# Patient Record
Sex: Male | Born: 1945 | Race: Black or African American | Hispanic: No | Smoking: Never smoker
Health system: Southern US, Community
[De-identification: ages and names within clinical notes are randomized; demographics above are authoritative.]

## PROBLEM LIST (undated history)

## (undated) DIAGNOSIS — E785 Hyperlipidemia, unspecified: Secondary | ICD-10-CM

## (undated) DIAGNOSIS — Z8619 Personal history of other infectious and parasitic diseases: Secondary | ICD-10-CM

## (undated) DIAGNOSIS — M1990 Unspecified osteoarthritis, unspecified site: Secondary | ICD-10-CM

## (undated) DIAGNOSIS — M179 Osteoarthritis of knee, unspecified: Secondary | ICD-10-CM

## (undated) DIAGNOSIS — N400 Enlarged prostate without lower urinary tract symptoms: Secondary | ICD-10-CM

## (undated) DIAGNOSIS — D709 Neutropenia, unspecified: Secondary | ICD-10-CM

## (undated) DIAGNOSIS — D126 Benign neoplasm of colon, unspecified: Secondary | ICD-10-CM

## (undated) DIAGNOSIS — I10 Essential (primary) hypertension: Secondary | ICD-10-CM

## (undated) DIAGNOSIS — H409 Unspecified glaucoma: Secondary | ICD-10-CM

## (undated) HISTORY — PX: CATARACT EXTRACTION, BILATERAL: SHX1313

## (undated) HISTORY — PX: CHOLECYSTECTOMY: SHX55

## (undated) HISTORY — PX: JOINT REPLACEMENT: SHX530

## (undated) HISTORY — PX: KNEE SURGERY: SHX244

## (undated) HISTORY — PX: EYE SURGERY: SHX253

---

## 2012-05-27 DIAGNOSIS — H40219 Acute angle-closure glaucoma, unspecified eye: Secondary | ICD-10-CM | POA: Insufficient documentation

## 2012-07-31 DIAGNOSIS — Z961 Presence of intraocular lens: Secondary | ICD-10-CM | POA: Insufficient documentation

## 2014-06-10 DIAGNOSIS — D709 Neutropenia, unspecified: Secondary | ICD-10-CM | POA: Insufficient documentation

## 2014-06-10 DIAGNOSIS — N400 Enlarged prostate without lower urinary tract symptoms: Secondary | ICD-10-CM | POA: Insufficient documentation

## 2014-06-10 DIAGNOSIS — I10 Essential (primary) hypertension: Secondary | ICD-10-CM | POA: Insufficient documentation

## 2014-06-10 DIAGNOSIS — E785 Hyperlipidemia, unspecified: Secondary | ICD-10-CM | POA: Insufficient documentation

## 2015-02-05 DIAGNOSIS — N4231 Prostatic intraepithelial neoplasia: Secondary | ICD-10-CM | POA: Insufficient documentation

## 2015-02-05 DIAGNOSIS — N529 Male erectile dysfunction, unspecified: Secondary | ICD-10-CM | POA: Insufficient documentation

## 2015-09-09 ENCOUNTER — Encounter: Payer: Self-pay | Admitting: *Deleted

## 2015-09-10 ENCOUNTER — Ambulatory Visit: Payer: Commercial Managed Care - HMO | Admitting: *Deleted

## 2015-09-10 ENCOUNTER — Encounter: Payer: Self-pay | Admitting: *Deleted

## 2015-09-10 ENCOUNTER — Encounter
Admission: RE | Disposition: A | Discharge status: Home / Self Care | Payer: Self-pay | Source: Ambulatory Visit | Attending: Gastroenterology

## 2015-09-10 ENCOUNTER — Ambulatory Visit
Admission: RE | Admit: 2015-09-10 | Discharge: 2015-09-10 | Disposition: A | Discharge status: Home / Self Care | Payer: Commercial Managed Care - HMO | Source: Ambulatory Visit | Attending: Gastroenterology | Admitting: Gastroenterology

## 2015-09-10 DIAGNOSIS — H409 Unspecified glaucoma: Secondary | ICD-10-CM | POA: Insufficient documentation

## 2015-09-10 DIAGNOSIS — Z9849 Cataract extraction status, unspecified eye: Secondary | ICD-10-CM | POA: Diagnosis not present

## 2015-09-10 DIAGNOSIS — Z1211 Encounter for screening for malignant neoplasm of colon: Secondary | ICD-10-CM | POA: Insufficient documentation

## 2015-09-10 DIAGNOSIS — N400 Enlarged prostate without lower urinary tract symptoms: Secondary | ICD-10-CM | POA: Diagnosis not present

## 2015-09-10 DIAGNOSIS — D709 Neutropenia, unspecified: Secondary | ICD-10-CM | POA: Insufficient documentation

## 2015-09-10 DIAGNOSIS — Z79899 Other long term (current) drug therapy: Secondary | ICD-10-CM | POA: Insufficient documentation

## 2015-09-10 DIAGNOSIS — E785 Hyperlipidemia, unspecified: Secondary | ICD-10-CM | POA: Insufficient documentation

## 2015-09-10 DIAGNOSIS — D123 Benign neoplasm of transverse colon: Secondary | ICD-10-CM | POA: Insufficient documentation

## 2015-09-10 DIAGNOSIS — D122 Benign neoplasm of ascending colon: Secondary | ICD-10-CM | POA: Diagnosis not present

## 2015-09-10 DIAGNOSIS — I10 Essential (primary) hypertension: Secondary | ICD-10-CM | POA: Insufficient documentation

## 2015-09-10 DIAGNOSIS — D124 Benign neoplasm of descending colon: Secondary | ICD-10-CM | POA: Insufficient documentation

## 2015-09-10 HISTORY — DX: Unspecified glaucoma: H40.9

## 2015-09-10 HISTORY — DX: Hyperlipidemia, unspecified: E78.5

## 2015-09-10 HISTORY — DX: Neutropenia, unspecified: D70.9

## 2015-09-10 HISTORY — DX: Benign prostatic hyperplasia without lower urinary tract symptoms: N40.0

## 2015-09-10 HISTORY — DX: Essential (primary) hypertension: I10

## 2015-09-10 HISTORY — PX: COLONOSCOPY WITH PROPOFOL: SHX5780

## 2015-09-10 SURGERY — COLONOSCOPY WITH PROPOFOL
Anesthesia: General

## 2015-09-10 MED ORDER — LIDOCAINE HCL (CARDIAC) 20 MG/ML IV SOLN
INTRAVENOUS | Status: DC | PRN
  Administered 2015-09-10: 60 mg via INTRAVENOUS

## 2015-09-10 MED ORDER — SODIUM CHLORIDE 0.9 % IV SOLN
INTRAVENOUS | Status: DC
  Administered 2015-09-10: 1000 mL via INTRAVENOUS

## 2015-09-10 MED ORDER — PROPOFOL 500 MG/50ML IV EMUL
INTRAVENOUS | Status: DC | PRN
  Administered 2015-09-10: 140 ug/kg/min via INTRAVENOUS

## 2015-09-10 MED ORDER — GLYCOPYRROLATE 0.2 MG/ML IJ SOLN
INTRAMUSCULAR | Status: DC | PRN
  Administered 2015-09-10: 0.2 mg via INTRAVENOUS
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
## 2015-09-10 NOTE — Anesthesia Preprocedure Evaluation (Signed)
Anesthesia Evaluation  Patient identified by MRN, date of birth, ID band Patient awake    Reviewed: Allergy & Precautions, NPO status , Patient's Chart, lab work & pertinent test results  Airway Mallampati: II  TM Distance: >3 FB Neck ROM: Limited    Dental  (+) Missing, Poor Dentition   Pulmonary    Pulmonary exam normal        Cardiovascular Exercise Tolerance: Good hypertension, Pt. on medications Normal cardiovascular exam     Neuro/Psych    GI/Hepatic negative GI ROS, Neg liver ROS,   Endo/Other  negative endocrine ROS  Renal/GU negative Renal ROS     Musculoskeletal   Abdominal (+)  Abdomen: soft.    Peds  Hematology   Anesthesia Other Findings   Reproductive/Obstetrics                             Anesthesia Physical Anesthesia Plan  ASA: II  Anesthesia Plan: General   Post-op Pain Management:    Induction: Intravenous  Airway Management Planned: Nasal Cannula  Additional Equipment:   Intra-op Plan:   Post-operative Plan:   Informed Consent: I have reviewed the patients History and Physical, chart, labs and discussed the procedure including the risks, benefits and alternatives for the proposed anesthesia with the patient or authorized representative who has indicated his/her understanding and acceptance.     Plan Discussed with: CRNA  Anesthesia Plan Comments:         Anesthesia Quick Evaluation

## 2015-09-10 NOTE — Op Note (Signed)
[facility] Gastroenterology Patient Name: Nathan Merritt Procedure Date: 09/10/2015 10:27 AM MRN: [id number] Account #: [id number] Date of Birth: 1946/04/10 Admit Type: Outpatient Age: 70 Room: [facility] ENDO ROOM 2 Gender: Male Note Status: Finalized Procedure:         Colonoscopy Indications:       Screening for colorectal malignant neoplasm, This is the                     patient's first colonoscopy Patient Profile:   This is a 70 year old male. Providers:         Gerrit Heck. Rayann Heman, MD Referring MD:      Hewitt Blade. Sarina Ser, MD (Referring MD) Medicines:         Propofol per Anesthesia Complications:     No immediate complications. Procedure:         Pre-Anesthesia Assessment:                    - Prior to the procedure, a History and Physical was                     performed, and patient medications, allergies and                     sensitivities were reviewed. The patient's tolerance of                     previous anesthesia was reviewed.                    - Prior to the procedure, a History and Physical was                     performed, and patient medications, allergies and                     sensitivities were reviewed. The patient's tolerance of                     previous anesthesia was reviewed.                    After obtaining informed consent, the colonoscope was                     passed under direct vision. Throughout the procedure, the                     patient's blood pressure, pulse, and oxygen saturations                     were monitored continuously. The Colonoscope was                     introduced through the anus and advanced to the the cecum,                     identified by appendiceal orifice and ileocecal valve. The                     colonoscopy was performed without difficulty. The patient                     tolerated the procedure well. The quality of the bowel  preparation was excellent. Findings:  The perianal and digital rectal examinations were normal.      A 9 mm polyp was found in the mid ascending colon. The polyp was       sessile. The polyp was removed with a hot snare. Resection and retrieval       were complete.      Five sessile polyps were found at the hepatic flexure. The polyps were 3       to 10 mm in size. These polyps were removed with a hot snare and cold       snare as appropriate. Resection and retrieval were complete.      Three sessile polyps were found in the proximal transverse colon. The       polyps were 3 to 6 mm in size. These polyps were removed with a cold       snare. Resection and retrieval were complete.      Four sessile polyps were found in the descending colon. The polyps were       4 to 10 mm in size. These polyps were removed with a hot snare.       Resection and retrieval were complete.      A 15 mm polyp was found in the distal descending colon. The polyp was       sessile. The polyp was removed with a piecemeal technique using a hot       snare. Resection and retrieval were complete. Area was tattooed with an       injection of 3 mL of [place] ink.      The exam was otherwise without abnormality on direct and retroflexion       views. Impression:        - One 7 mm polyp in the mid ascending colon. Resected and                     retrieved.                    - Five 3 to 8 mm polyps at the hepatic flexure. Resected                     and retrieved.                    - Three 3 to 5 mm polyps in the proximal transverse colon.                     Resected and retrieved.                    - Four 4 to 10 mm polyps in the descending colon. Resected                     and retrieved.                    - One 15 mm polyp in the distal descending colon at 45 cm.                     Resected and retrieved. Tattooed just distally. Piecemeal.                    - The examination was otherwise normal on direct and  retroflexion  views. Recommendation:    - Observe patient in GI recovery unit.                    - High fiber diet.                    - Continue present medications.                    - Await pathology results.                    - Repeat colonoscopy in 6 months for surveillance after                     piecemeal polypectomy.                    - Return to referring physician.                    - The findings and recommendations were discussed with the                     patient.                    - The findings and recommendations were discussed with the                     patient's family. Procedure Code(s): --- Professional ---                    [phone], Colonoscopy, flexible; with removal of tumor(s),                     polyp(s), or other lesion(s) by snare technique                    45381, Colonoscopy, flexible; with directed submucosal                     injection(s), any substance Diagnosis Code(s): --- Professional ---                    Z12.11, Encounter for screening for malignant neoplasm of                     colon                    D12.2, Benign neoplasm of ascending colon                    D12.4, Benign neoplasm of descending colon                    D12.3, Benign neoplasm of transverse colon CPT copyright 2014 American Medical Association. All rights reserved. The codes documented in this report are preliminary and upon coder review may  be revised to meet current compliance requirements. Mellody Life, MD 09/10/2015 11:36:21 AM This report has been signed electronically. Number of Addenda: 0 Note Initiated On: 09/10/2015 10:27 AM Scope Withdrawal Time: 0 hours 50 minutes 41 seconds  Total Procedure Duration: 0 hours 55 minutes 18 seconds       [facility]

## 2015-09-10 NOTE — Anesthesia Postprocedure Evaluation (Signed)
Anesthesia Post Note  Patient: Nathan Merritt  Procedure(s) Performed: Procedure(s) (LRB): COLONOSCOPY WITH PROPOFOL (N/A)  Patient location during evaluation: PACU Anesthesia Type: General Level of consciousness: awake Pain management: pain level controlled Vital Signs Assessment: post-procedure vital signs reviewed and stable Respiratory status: spontaneous breathing Cardiovascular status: blood pressure returned to baseline Postop Assessment: no headache Anesthetic complications: no    Last Vitals:  Filed Vitals:   09/10/15 1157 09/10/15 1207  BP: 142/100 144/97  Pulse: 94 93  Temp:    Resp: 17 15    Last Pain: There were no vitals filed for this visit.               Lorella Gomez M

## 2015-09-10 NOTE — Discharge Instructions (Signed)

## 2015-09-10 NOTE — Transfer of Care (Signed)
Immediate Anesthesia Transfer of Care Note  Patient: EMIGDIO DUTY  Procedure(s) Performed: Procedure(s): COLONOSCOPY WITH PROPOFOL (N/A)  Patient Location: PACU and Endoscopy Unit  Anesthesia Type:General  Level of Consciousness: awake, alert  and oriented  Airway & Oxygen Therapy: Patient connected to nasal cannula oxygen  Post-op Assessment: Report given to RN and Post -op Vital signs reviewed and stable  Post vital signs: Reviewed and stable  Last Vitals: 11:38 99% 99 hr 96.9 16 resp 123/57  Filed Vitals:   09/10/15 0958  BP: 166/91  Pulse: 77  Temp: 36.3 C  Resp: 20    Complications: No apparent anesthesia complications

## 2015-09-14 LAB — SURGICAL PATHOLOGY

## 2015-09-15 ENCOUNTER — Encounter: Payer: Self-pay | Admitting: Gastroenterology

## 2018-09-29 DIAGNOSIS — M170 Bilateral primary osteoarthritis of knee: Secondary | ICD-10-CM | POA: Insufficient documentation

## 2018-09-29 DIAGNOSIS — M1712 Unilateral primary osteoarthritis, left knee: Secondary | ICD-10-CM | POA: Insufficient documentation

## 2018-11-03 NOTE — Discharge Instructions (Signed)
°  Instructions after Total Knee Replacement ° ° Kelise Kuch P. Renessa Wellnitz, Jr., M.D.    ° Dept. of Orthopaedics & Sports Medicine ° [facility] ° [address] ° [place], [place]  [address] ° Phone: [phone]   Fax: [phone] ° °  °DIET: °• Drink plenty of non-alcoholic fluids. °• Resume your normal diet. Include foods high in fiber. ° °ACTIVITY:  °• You may use crutches or a walker with weight-bearing as tolerated, unless instructed otherwise. °• You may be weaned off of the walker or crutches by your Physical Therapist.  °• Do NOT place pillows under the knee. Anything placed under the knee could limit your ability to straighten the knee.   °• Continue doing gentle exercises. Exercising will reduce the pain and swelling, increase motion, and prevent muscle weakness.   °• Please continue to use the TED compression stockings for 6 weeks. You may remove the stockings at night, but should reapply them in the morning. °• Do not drive or operate any equipment until instructed. ° °WOUND CARE:  °• Continue to use the PolarCare or ice packs periodically to reduce pain and swelling. °• You may bathe or shower after the staples are removed at the first office visit following surgery. ° °MEDICATIONS: °• You may resume your regular medications. °• Please take the pain medication as prescribed on the medication. °• Do not take pain medication on an empty stomach. °• You have been given a prescription for a blood thinner (Lovenox or Coumadin). Please take the medication as instructed. (NOTE: After completing a 2 week course of Lovenox, take one Enteric-coated aspirin once a day. This along with elevation will help reduce the possibility of phlebitis in your operated leg.) °• Do not drive or drink alcoholic beverages when taking pain medications. ° °CALL THE OFFICE FOR: °• Temperature above 101 degrees °• Excessive bleeding or drainage on the dressing. °• Excessive swelling, coldness, or paleness of the toes. °• Persistent  nausea and vomiting. ° °FOLLOW-UP:  °• You should have an appointment to return to the office in 10-14 days after surgery. °• Arrangements have been made for continuation of Physical Therapy (either home therapy or outpatient therapy). °  °

## 2018-11-06 ENCOUNTER — Encounter: Payer: Self-pay | Admitting: Anesthesiology

## 2018-11-06 ENCOUNTER — Encounter
Admission: RE | Admit: 2018-11-06 | Discharge: 2018-11-06 | Disposition: A | Discharge status: Home / Self Care | Payer: Medicare PPO | Source: Ambulatory Visit | Attending: Orthopedic Surgery | Admitting: Orthopedic Surgery

## 2018-11-06 ENCOUNTER — Other Ambulatory Visit: Payer: Self-pay

## 2018-11-06 DIAGNOSIS — Z01818 Encounter for other preprocedural examination: Secondary | ICD-10-CM | POA: Insufficient documentation

## 2018-11-06 DIAGNOSIS — I10 Essential (primary) hypertension: Secondary | ICD-10-CM | POA: Diagnosis not present

## 2018-11-06 HISTORY — DX: Unspecified osteoarthritis, unspecified site: M19.90

## 2018-11-06 LAB — CBC
HCT: 46.9 % (ref 39.0–52.0)
Hemoglobin: 15.8 g/dL (ref 13.0–17.0)
MCH: 30.5 pg (ref 26.0–34.0)
MCHC: 33.7 g/dL (ref 30.0–36.0)
MCV: 90.5 fL (ref 80.0–100.0)
Platelets: 215 10*3/uL (ref 150–400)
RBC: 5.18 MIL/uL (ref 4.22–5.81)
RDW: 13.2 % (ref 11.5–15.5)
WBC: 5 10*3/uL (ref 4.0–10.5)
nRBC: 0 % (ref 0.0–0.2)

## 2018-11-06 LAB — COMPREHENSIVE METABOLIC PANEL
ALT: 16 U/L (ref 0–44)
AST: 19 U/L (ref 15–41)
Albumin: 4.3 g/dL (ref 3.5–5.0)
Alkaline Phosphatase: 125 U/L (ref 38–126)
Anion gap: 9 (ref 5–15)
BUN: 21 mg/dL (ref 8–23)
CO2: 29 mmol/L (ref 22–32)
Calcium: 9.4 mg/dL (ref 8.9–10.3)
Chloride: 102 mmol/L (ref 98–111)
Creatinine, Ser: 0.99 mg/dL (ref 0.61–1.24)
GFR calc Af Amer: >60 mL/min (ref >60)
GFR calc non Af Amer: >60 mL/min (ref >60)
Glucose, Bld: 107 mg/dL — ABNORMAL HIGH (ref 70–99)
Potassium: 3.8 mmol/L (ref 3.5–5.1)
Sodium: 140 mmol/L (ref 135–145)
Total Bilirubin: 1.4 mg/dL — ABNORMAL HIGH (ref 0.3–1.2)
Total Protein: 7 g/dL (ref 6.5–8.1)

## 2018-11-06 LAB — TYPE AND SCREEN
ABO/RH(D): O POS
Antibody Screen: NEGATIVE

## 2018-11-06 LAB — URINALYSIS, ROUTINE W REFLEX MICROSCOPIC
Bilirubin Urine: NEGATIVE
Glucose, UA: NEGATIVE mg/dL
Hgb urine dipstick: NEGATIVE
Ketones, ur: NEGATIVE mg/dL
LEUKOCYTE UA: NEGATIVE
Nitrite: NEGATIVE
PROTEIN: NEGATIVE mg/dL
Specific Gravity, Urine: 1.023 (ref 1.005–1.030)
pH: 5 (ref 5.0–8.0)

## 2018-11-06 LAB — PROTIME-INR
INR: 1 (ref 0.8–1.2)
Prothrombin Time: 13.5 seconds (ref 11.4–15.2)

## 2018-11-06 LAB — C-REACTIVE PROTEIN: CRP: <0.8 mg/dL (ref <1.0)

## 2018-11-06 LAB — APTT: aPTT: 38 seconds — ABNORMAL HIGH (ref 24–36)

## 2018-11-06 LAB — SURGICAL PCR SCREEN
MRSA, PCR: NEGATIVE
Staphylococcus aureus: NEGATIVE

## 2018-11-06 LAB — SEDIMENTATION RATE: Sed Rate: 5 mm/hr (ref 0–20)

## 2018-11-06 NOTE — Pre-Procedure Instructions (Signed)
Abnormal APTT faxed to Dr Clydell Hakim office

## 2018-11-06 NOTE — Pre-Procedure Instructions (Signed)
EKG REVIEWED AND OK BY DR Ola Spurr

## 2018-11-06 NOTE — Patient Instructions (Signed)
Your procedure is scheduled on: MD OFFICE WILL CONTACT YOU Report to Day Surgery on the 2nd floor of the [facility]. To find out your arrival time, please call [phone] between 1PM - 3PM on: [place] ON Friday IF SURGERY IS SCHEDULED FOR MONDAY  REMEMBER: Instructions that are not followed completely may result in serious medical risk, up to and including death; or upon the discretion of your surgeon and anesthesiologist your surgery may need to be rescheduled.  Do not eat food after midnight the night before surgery.  No gum chewing, lozengers or hard candies.  You may however, drink CLEAR liquids up to 2 hours before you are scheduled to arrive for your surgery. Do not drink anything within 2 hours of the start of your surgery.  Clear liquids include: - water  - apple juice without pulp - CLEAR gatorade - black coffee or tea (Do NOT add milk or creamers to the coffee or tea) Do NOT drink anything that is not on this list.  Type 1 and Type 2 diabetics should only drink water.     No Alcohol for 24 hours before or after surgery.  No Smoking including e-cigarettes for 24 hours prior to surgery.  No chewable tobacco products for at least 6 hours prior to surgery.  No nicotine patches on the day of surgery.  On the morning of surgery brush your teeth with toothpaste and water, you may rinse your mouth with mouthwash if you wish. Do not swallow any toothpaste or mouthwash.  Notify your doctor if there is any change in your medical condition (cold, fever, infection).  Do not wear jewelry, make-up, hairpins, clips or nail polish.  Do not wear lotions, powders, or perfumes NO COLOGNE NOT DEODORANT   Do not shave 48 hours prior to surgery.   Contacts and dentures may not be worn into surgery.  Do not bring valuables to the hospital, including drivers license, insurance or credit cards.  [facility] is not responsible for any belongings or valuables.    TAKE THESE MEDICATIONS THE MORNING OF SURGERY:    Use CHG Soap  as directed on instruction sheet.    Stop Anti-inflammatories (NSAIDS) such as Advil, Aleve, Ibuprofen, Motrin, Naproxen, Naprosyn and Aspirin based products such as Excedrin, Goodys Powder, BC Powder. (May take Tylenol or Acetaminophen if needed.)  NONE 7 DAYS BEFORE SURGERY   Stop ANY OVER THE COUNTER supplements until after surgery. (May continue Vitamin D, Vitamin B, and multivitamin.)  Wear comfortable clothing (specific to your surgery type) to the hospital.  Plan for stool softeners for home use.  If you are being admitted to the hospital overnight, leave your suitcase in the car. After surgery it may be brought to your room.  If you are being discharged the day of surgery, you will not be allowed to drive home. You will need a responsible adult to drive you home and stay with you that night.   If you are taking public transportation, you will need to have a responsible adult with you. Please confirm with your physician that it is acceptable to use public transportation.   Please call [phone] if you have any questions about these instructions.

## 2018-11-07 LAB — URINE CULTURE
Culture: NO GROWTH
Special Requests: NORMAL

## 2018-11-20 ENCOUNTER — Encounter: Admission: RE | Payer: Self-pay | Source: Home / Self Care

## 2018-11-20 ENCOUNTER — Inpatient Hospital Stay: Admission: RE | Admit: 2018-11-20 | Payer: Medicare PPO | Source: Home / Self Care | Admitting: Orthopedic Surgery

## 2018-11-20 SURGERY — ARTHROPLASTY, KNEE, TOTAL, USING IMAGELESS COMPUTER-ASSISTED NAVIGATION
Anesthesia: Choice | Laterality: Right

## 2019-01-26 NOTE — Discharge Instructions (Signed)
°  Instructions after Total Knee Replacement ° ° Eyden Dobie P. Walker Sitar, Jr., M.D.    ° Dept. of Orthopaedics & Sports Medicine ° [facility] ° [address] ° [place], [place]  [address] ° Phone: [phone]   Fax: [phone] ° °  °DIET: °• Drink plenty of non-alcoholic fluids. °• Resume your normal diet. Include foods high in fiber. ° °ACTIVITY:  °• You may use crutches or a walker with weight-bearing as tolerated, unless instructed otherwise. °• You may be weaned off of the walker or crutches by your Physical Therapist.  °• Do NOT place pillows under the knee. Anything placed under the knee could limit your ability to straighten the knee.   °• Continue doing gentle exercises. Exercising will reduce the pain and swelling, increase motion, and prevent muscle weakness.   °• Please continue to use the TED compression stockings for 6 weeks. You may remove the stockings at night, but should reapply them in the morning. °• Do not drive or operate any equipment until instructed. ° °WOUND CARE:  °• Continue to use the PolarCare or ice packs periodically to reduce pain and swelling. °• You may bathe or shower after the staples are removed at the first office visit following surgery. ° °MEDICATIONS: °• You may resume your regular medications. °• Please take the pain medication as prescribed on the medication. °• Do not take pain medication on an empty stomach. °• You have been given a prescription for a blood thinner (Lovenox or Coumadin). Please take the medication as instructed. (NOTE: After completing a 2 week course of Lovenox, take one Enteric-coated aspirin once a day. This along with elevation will help reduce the possibility of phlebitis in your operated leg.) °• Do not drive or drink alcoholic beverages when taking pain medications. ° °CALL THE OFFICE FOR: °• Temperature above 101 degrees °• Excessive bleeding or drainage on the dressing. °• Excessive swelling, coldness, or paleness of the toes. °• Persistent  nausea and vomiting. ° °FOLLOW-UP:  °• You should have an appointment to return to the office in 10-14 days after surgery. °• Arrangements have been made for continuation of Physical Therapy (either home therapy or outpatient therapy). °  °

## 2019-01-30 ENCOUNTER — Encounter
Admission: RE | Admit: 2019-01-30 | Discharge: 2019-01-30 | Disposition: A | Discharge status: Home / Self Care | Payer: Medicare PPO | Source: Ambulatory Visit | Attending: Orthopedic Surgery | Admitting: Orthopedic Surgery

## 2019-01-30 ENCOUNTER — Other Ambulatory Visit: Payer: Self-pay

## 2019-01-30 DIAGNOSIS — Z1159 Encounter for screening for other viral diseases: Secondary | ICD-10-CM | POA: Diagnosis not present

## 2019-01-30 DIAGNOSIS — I10 Essential (primary) hypertension: Secondary | ICD-10-CM | POA: Diagnosis not present

## 2019-01-30 DIAGNOSIS — E785 Hyperlipidemia, unspecified: Secondary | ICD-10-CM | POA: Diagnosis not present

## 2019-01-30 DIAGNOSIS — N400 Enlarged prostate without lower urinary tract symptoms: Secondary | ICD-10-CM | POA: Insufficient documentation

## 2019-01-30 DIAGNOSIS — Z01812 Encounter for preprocedural laboratory examination: Secondary | ICD-10-CM | POA: Diagnosis not present

## 2019-01-30 DIAGNOSIS — M1711 Unilateral primary osteoarthritis, right knee: Secondary | ICD-10-CM | POA: Diagnosis not present

## 2019-01-30 LAB — COMPREHENSIVE METABOLIC PANEL
ALT: 16 U/L (ref 0–44)
AST: 31 U/L (ref 15–41)
Albumin: 4.2 g/dL (ref 3.5–5.0)
Alkaline Phosphatase: 114 U/L (ref 38–126)
Anion gap: 7 (ref 5–15)
BUN: 18 mg/dL (ref 8–23)
CO2: 26 mmol/L (ref 22–32)
Calcium: 8.8 mg/dL — ABNORMAL LOW (ref 8.9–10.3)
Chloride: 105 mmol/L (ref 98–111)
Creatinine, Ser: 1.03 mg/dL (ref 0.61–1.24)
GFR calc Af Amer: >60 mL/min (ref >60)
GFR calc non Af Amer: >60 mL/min (ref >60)
Glucose, Bld: 111 mg/dL — ABNORMAL HIGH (ref 70–99)
Potassium: 3.1 mmol/L — ABNORMAL LOW (ref 3.5–5.1)
Sodium: 138 mmol/L (ref 135–145)
Total Bilirubin: 2.1 mg/dL — ABNORMAL HIGH (ref 0.3–1.2)
Total Protein: 7.2 g/dL (ref 6.5–8.1)

## 2019-01-30 LAB — URINALYSIS, ROUTINE W REFLEX MICROSCOPIC
Bacteria, UA: NONE SEEN
Bilirubin Urine: NEGATIVE
Glucose, UA: NEGATIVE mg/dL
Hgb urine dipstick: NEGATIVE
Ketones, ur: NEGATIVE mg/dL
Leukocytes,Ua: NEGATIVE
Nitrite: NEGATIVE
Protein, ur: 30 mg/dL — AB
Specific Gravity, Urine: 1.021 (ref 1.005–1.030)
pH: 5 (ref 5.0–8.0)

## 2019-01-30 LAB — CBC
HCT: 43.9 % (ref 39.0–52.0)
Hemoglobin: 15.3 g/dL (ref 13.0–17.0)
MCH: 30.5 pg (ref 26.0–34.0)
MCHC: 34.9 g/dL (ref 30.0–36.0)
MCV: 87.6 fL (ref 80.0–100.0)
Platelets: 221 10*3/uL (ref 150–400)
RBC: 5.01 MIL/uL (ref 4.22–5.81)
RDW: 12.9 % (ref 11.5–15.5)
WBC: 4.2 10*3/uL (ref 4.0–10.5)
nRBC: 0 % (ref 0.0–0.2)

## 2019-01-30 LAB — TYPE AND SCREEN
ABO/RH(D): O POS
Antibody Screen: NEGATIVE

## 2019-01-30 LAB — C-REACTIVE PROTEIN: CRP: <0.8 mg/dL (ref <1.0)

## 2019-01-30 LAB — APTT: aPTT: 38 seconds — ABNORMAL HIGH (ref 24–36)

## 2019-01-30 LAB — SEDIMENTATION RATE: Sed Rate: 5 mm/hr (ref 0–20)

## 2019-01-30 LAB — SURGICAL PCR SCREEN
MRSA, PCR: NEGATIVE
Staphylococcus aureus: NEGATIVE

## 2019-01-30 LAB — PROTIME-INR
INR: 1.1 (ref 0.8–1.2)
Prothrombin Time: 14.3 seconds (ref 11.4–15.2)

## 2019-01-30 MED ORDER — ENSURE PRE-SURGERY PO LIQD
296.0000 mL | Freq: Once | ORAL | Status: DC
  Filled 2019-01-30: qty 296

## 2019-01-30 NOTE — Pre-Procedure Instructions (Signed)
Incentive spirometry and carb drink given.

## 2019-01-30 NOTE — Patient Instructions (Signed)
Your procedure is scheduled on: 02/03/2019 Mon Report to Same Day Surgery 2nd floor medical mall [facility] Entrance-take elevator on left to 2nd floor.  Check in with surgery information desk.) To find out your arrival time please call [phone] between 1PM - 3PM on 01/31/2019 Fri  Remember: Instructions that are not followed completely may result in serious medical risk, up to and including death, or upon the discretion of your surgeon and anesthesiologist your surgery may need to be rescheduled.    _x___ 1. Do not eat food after midnight the night before your procedure. You may drink clear liquids up to 2 hours before you are scheduled to arrive at the hospital for your procedure.  Do not drink clear liquids within 2 hours of your scheduled arrival to the hospital.  Clear liquids include  --Water or Apple juice without pulp  --Clear carbohydrate beverage such as ClearFast or Gatorade  --Black Coffee or Clear Tea (No milk, no creamers, do not add anything to                  the coffee or Tea Type 1 and type 2 diabetics should only drink water.   ____Ensure clear carbohydrate drink on the way to the hospital for bariatric patients  ____Ensure clear carbohydrate drink 3 hours before surgery for Dr Dwyane Luo patients if physician instructed.   No gum chewing or hard candies.     __x__ 2. No Alcohol for 24 hours before or after surgery.   __x__3. No Smoking or e-cigarettes for 24 prior to surgery.  Do not use any chewable tobacco products for at least 6 hour prior to surgery   ____  4. Bring all medications with you on the day of surgery if instructed.    __x__ 5. Notify your doctor if there is any change in your medical condition     (cold, fever, infections).    x___6. On the morning of surgery brush your teeth with toothpaste and water.  You may rinse your mouth with mouth wash if you wish.  Do not swallow any toothpaste or mouthwash.   Do not wear jewelry, make-up, hairpins,  clips or nail polish.  Do not wear lotions, powders, or perfumes. You may wear deodorant.  Do not shave 48 hours prior to surgery. Men may shave face and neck.  Do not bring valuables to the hospital.    [facility] is not responsible for any belongings or valuables.               Contacts, dentures or bridgework may not be worn into surgery.  Leave your suitcase in the car. After surgery it may be brought to your room.  For patients admitted to the hospital, discharge time is determined by your                       treatment team.  _  Patients discharged the day of surgery will not be allowed to drive home.  You will need someone to drive you home and stay with you the night of your procedure.    Please read over the following fact sheets that you were given:   [facility] Preparing for Surgery and or MRSA Information   _x___ Take anti-hypertensive listed below, cardiac, seizure, asthma,     anti-reflux and psychiatric medicines. These include:  1. amLODipine (NORVASC) 10 MG tablet  2.dorzolamide-timolol (COSOPT) 22.3-6.8 MG/ML ophthalmic solution  3.prednisoLONE acetate (PRED FORTE) 1 %  ophthalmic suspension  4.  5.  6.  ____Fleets enema or Magnesium Citrate as directed.   _x___ Use CHG Soap or sage wipes as directed on instruction sheet   ____ Use inhalers on the day of surgery and bring to hospital day of surgery  ____ Stop Metformin and Janumet 2 days prior to surgery.    ____ Take 1/2 of usual insulin dose the night before surgery and none on the morning     surgery.   _x___ Follow recommendations from Cardiologist, Pulmonologist or PCP regarding          stopping Aspirin, Coumadin, Plavix ,Eliquis, Effient, or Pradaxa, and Pletal.  X____Stop Anti-inflammatories such as Advil, Aleve, Ibuprofen, Motrin, Naproxen, Naprosyn, Goodies powders or aspirin products. OK to take Tylenol and                          Celebrex.   _x___ Stop supplements until after surgery.  But may  continue Vitamin D, Vitamin B,       and multivitamin.   ____ Bring C-Pap to the hospital.

## 2019-01-31 LAB — NOVEL CORONAVIRUS, NAA (HOSP ORDER, SEND-OUT TO REF LAB; TAT 18-24 HRS): SARS-CoV-2, NAA: NOT DETECTED

## 2019-02-01 LAB — URINE CULTURE
Culture: <10000 — AB
Special Requests: NORMAL

## 2019-02-02 MED ORDER — TRANEXAMIC ACID-NACL 1000-0.7 MG/100ML-% IV SOLN
1000.0000 mg | INTRAVENOUS | Status: AC
  Administered 2019-02-03: 1000 mg via INTRAVENOUS
  Filled 2019-02-02: qty 100

## 2019-02-03 ENCOUNTER — Ambulatory Visit: Payer: Medicare PPO | Admitting: Certified Registered Nurse Anesthetist

## 2019-02-03 ENCOUNTER — Observation Stay
Admission: RE | Admit: 2019-02-03 | Discharge: 2019-02-05 | Disposition: A | Discharge status: Skilled Nursing Facility | Payer: Medicare PPO | Attending: Orthopedic Surgery | Admitting: Orthopedic Surgery

## 2019-02-03 ENCOUNTER — Encounter
Admission: RE | Disposition: A | Discharge status: Skilled Nursing Facility | Payer: Self-pay | Source: Home / Self Care | Attending: Orthopedic Surgery

## 2019-02-03 ENCOUNTER — Other Ambulatory Visit: Payer: Self-pay

## 2019-02-03 ENCOUNTER — Encounter: Payer: Self-pay | Admitting: Orthopedic Surgery

## 2019-02-03 ENCOUNTER — Inpatient Hospital Stay: Payer: Medicare PPO

## 2019-02-03 DIAGNOSIS — M1711 Unilateral primary osteoarthritis, right knee: Secondary | ICD-10-CM | POA: Diagnosis not present

## 2019-02-03 DIAGNOSIS — M25561 Pain in right knee: Secondary | ICD-10-CM | POA: Diagnosis present

## 2019-02-03 DIAGNOSIS — M25761 Osteophyte, right knee: Secondary | ICD-10-CM | POA: Insufficient documentation

## 2019-02-03 DIAGNOSIS — I10 Essential (primary) hypertension: Secondary | ICD-10-CM | POA: Diagnosis not present

## 2019-02-03 DIAGNOSIS — Z79899 Other long term (current) drug therapy: Secondary | ICD-10-CM | POA: Diagnosis not present

## 2019-02-03 DIAGNOSIS — H409 Unspecified glaucoma: Secondary | ICD-10-CM | POA: Diagnosis not present

## 2019-02-03 DIAGNOSIS — Z96659 Presence of unspecified artificial knee joint: Secondary | ICD-10-CM

## 2019-02-03 HISTORY — PX: KNEE ARTHROPLASTY: SHX992

## 2019-02-03 LAB — TYPE AND SCREEN
ABO/RH(D): O POS
Antibody Screen: NEGATIVE

## 2019-02-03 LAB — PROTIME-INR
INR: 1 (ref 0.8–1.2)
Prothrombin Time: 13.1 seconds (ref 11.4–15.2)

## 2019-02-03 LAB — APTT: aPTT: 36 seconds (ref 24–36)

## 2019-02-03 LAB — POCT I-STAT 4, (NA,K, GLUC, HGB,HCT)
Glucose, Bld: 113 mg/dL — ABNORMAL HIGH (ref 70–99)
HCT: 43 % (ref 39.0–52.0)
Hemoglobin: 14.6 g/dL (ref 13.0–17.0)
Potassium: 3.8 mmol/L (ref 3.5–5.1)
Sodium: 141 mmol/L (ref 135–145)

## 2019-02-03 SURGERY — ARTHROPLASTY, KNEE, TOTAL, USING IMAGELESS COMPUTER-ASSISTED NAVIGATION
Anesthesia: Spinal | Site: Knee | Laterality: Right

## 2019-02-03 MED ORDER — ONDANSETRON HCL 4 MG/2ML IJ SOLN
INTRAMUSCULAR | Status: DC | PRN
  Administered 2019-02-03: 4 mg via INTRAVENOUS

## 2019-02-03 MED ORDER — MIDAZOLAM HCL 2 MG/2ML IJ SOLN
INTRAMUSCULAR | Status: AC
  Filled 2019-02-03: qty 2

## 2019-02-03 MED ORDER — CELECOXIB 200 MG PO CAPS: 200.0000 mg | ORAL_CAPSULE | Freq: Two times a day (BID) | ORAL | Status: DC

## 2019-02-03 MED ORDER — FERROUS SULFATE 325 (65 FE) MG PO TABS
325.0000 mg | ORAL_TABLET | Freq: Two times a day (BID) | ORAL | Status: DC
  Administered 2019-02-04 – 2019-02-05 (×3): 325 mg via ORAL
  Filled 2019-02-03 (×3): qty 1

## 2019-02-03 MED ORDER — GABAPENTIN 300 MG PO CAPS
300.0000 mg | ORAL_CAPSULE | Freq: Once | ORAL | Status: AC
  Administered 2019-02-03: 300 mg via ORAL

## 2019-02-03 MED ORDER — ACETAMINOPHEN 10 MG/ML IV SOLN
1000.0000 mg | Freq: Four times a day (QID) | INTRAVENOUS | Status: AC
  Administered 2019-02-03 – 2019-02-04 (×4): 1000 mg via INTRAVENOUS
  Filled 2019-02-03 (×4): qty 100

## 2019-02-03 MED ORDER — OXYCODONE HCL 5 MG PO TABS
10.0000 mg | ORAL_TABLET | ORAL | Status: DC | PRN
  Administered 2019-02-03 – 2019-02-05 (×4): 10 mg via ORAL
  Filled 2019-02-03 (×4): qty 2

## 2019-02-03 MED ORDER — TETRACAINE HCL 1 % IJ SOLN
INTRAMUSCULAR | Status: AC
  Filled 2019-02-03: qty 2

## 2019-02-03 MED ORDER — SODIUM CHLORIDE 0.9 % IV SOLN
INTRAVENOUS | Status: DC | PRN
  Administered 2019-02-03: 60 mL

## 2019-02-03 MED ORDER — BISACODYL 10 MG RE SUPP: 10.0000 mg | Freq: Every day | RECTAL | Status: DC | PRN

## 2019-02-03 MED ORDER — PROPOFOL 500 MG/50ML IV EMUL
INTRAVENOUS | Status: AC
  Filled 2019-02-03: qty 50

## 2019-02-03 MED ORDER — BUPIVACAINE HCL (PF) 0.25 % IJ SOLN
INTRAMUSCULAR | Status: DC | PRN
  Administered 2019-02-03: 60 mL

## 2019-02-03 MED ORDER — PHENOL 1.4 % MT LIQD
1.0000 | OROMUCOSAL | Status: DC | PRN
  Filled 2019-02-03: qty 177

## 2019-02-03 MED ORDER — ONDANSETRON HCL 4 MG PO TABS: 4.0000 mg | ORAL_TABLET | Freq: Four times a day (QID) | ORAL | Status: DC | PRN

## 2019-02-03 MED ORDER — HYDROMORPHONE HCL 1 MG/ML IJ SOLN: 0.5000 mg | INTRAMUSCULAR | Status: DC | PRN

## 2019-02-03 MED ORDER — MIDAZOLAM HCL 5 MG/5ML IJ SOLN
INTRAMUSCULAR | Status: DC | PRN
  Administered 2019-02-03 (×2): 1 mg via INTRAVENOUS

## 2019-02-03 MED ORDER — CEFAZOLIN SODIUM-DEXTROSE 2-4 GM/100ML-% IV SOLN
2.0000 g | Freq: Four times a day (QID) | INTRAVENOUS | Status: AC
  Administered 2019-02-03 – 2019-02-04 (×4): 2 g via INTRAVENOUS
  Filled 2019-02-03 (×4): qty 100

## 2019-02-03 MED ORDER — FLEET ENEMA 7-19 GM/118ML RE ENEM: 1.0000 | ENEMA | Freq: Once | RECTAL | Status: DC | PRN

## 2019-02-03 MED ORDER — PROPOFOL 500 MG/50ML IV EMUL
INTRAVENOUS | Status: DC | PRN
  Administered 2019-02-03: 100 ug/kg/min via INTRAVENOUS

## 2019-02-03 MED ORDER — NEOMYCIN-POLYMYXIN B GU 40-200000 IR SOLN
Status: AC
  Filled 2019-02-03: qty 2

## 2019-02-03 MED ORDER — PREDNISOLONE ACETATE 1 % OP SUSP
1.0000 [drp] | Freq: Every day | OPHTHALMIC | Status: DC
  Administered 2019-02-04 – 2019-02-05 (×2): 1 [drp] via OPHTHALMIC
  Filled 2019-02-03: qty 1

## 2019-02-03 MED ORDER — TRANEXAMIC ACID-NACL 1000-0.7 MG/100ML-% IV SOLN
1000.0000 mg | Freq: Once | INTRAVENOUS | Status: AC
  Administered 2019-02-03: 1000 mg via INTRAVENOUS
  Filled 2019-02-03: qty 100

## 2019-02-03 MED ORDER — FENTANYL CITRATE (PF) 100 MCG/2ML IJ SOLN
INTRAMUSCULAR | Status: AC
  Filled 2019-02-03: qty 2

## 2019-02-03 MED ORDER — SODIUM CHLORIDE 0.9 % IV SOLN
INTRAVENOUS | Status: DC
  Administered 2019-02-03: 14:00:00 via INTRAVENOUS

## 2019-02-03 MED ORDER — CEFAZOLIN SODIUM-DEXTROSE 2-4 GM/100ML-% IV SOLN
INTRAVENOUS | Status: AC
  Filled 2019-02-03: qty 100

## 2019-02-03 MED ORDER — CEFAZOLIN SODIUM-DEXTROSE 2-4 GM/100ML-% IV SOLN
2.0000 g | INTRAVENOUS | Status: AC
  Administered 2019-02-03: 2 g via INTRAVENOUS

## 2019-02-03 MED ORDER — LACTATED RINGERS IV SOLN
INTRAVENOUS | Status: DC
  Administered 2019-02-03: 1000 mL via INTRAVENOUS
  Administered 2019-02-03: 09:00:00 via INTRAVENOUS

## 2019-02-03 MED ORDER — BUPIVACAINE HCL (PF) 0.5 % IJ SOLN
INTRAMUSCULAR | Status: AC
  Filled 2019-02-03: qty 30

## 2019-02-03 MED ORDER — ACETAMINOPHEN 10 MG/ML IV SOLN
INTRAVENOUS | Status: DC | PRN
  Administered 2019-02-03: 1000 mg via INTRAVENOUS

## 2019-02-03 MED ORDER — BUPIVACAINE LIPOSOME 1.3 % IJ SUSP
INTRAMUSCULAR | Status: AC
  Filled 2019-02-03: qty 40

## 2019-02-03 MED ORDER — DIPHENHYDRAMINE HCL 12.5 MG/5ML PO ELIX: 12.5000 mg | ORAL_SOLUTION | ORAL | Status: DC | PRN

## 2019-02-03 MED ORDER — PHENYLEPHRINE HCL (PRESSORS) 10 MG/ML IV SOLN
INTRAVENOUS | Status: DC | PRN
  Administered 2019-02-03 (×8): 100 ug via INTRAVENOUS

## 2019-02-03 MED ORDER — FAMOTIDINE 20 MG PO TABS
ORAL_TABLET | ORAL | Status: AC
  Filled 2019-02-03: qty 1

## 2019-02-03 MED ORDER — TRAMADOL HCL 50 MG PO TABS: 50.0000 mg | ORAL_TABLET | ORAL | Status: DC | PRN

## 2019-02-03 MED ORDER — MENTHOL 3 MG MT LOZG
1.0000 | LOZENGE | OROMUCOSAL | Status: DC | PRN
  Filled 2019-02-03: qty 9

## 2019-02-03 MED ORDER — FAMOTIDINE 20 MG PO TABS
20.0000 mg | ORAL_TABLET | Freq: Once | ORAL | Status: AC
  Administered 2019-02-03: 20 mg via ORAL

## 2019-02-03 MED ORDER — SODIUM CHLORIDE 0.9 % IV SOLN
INTRAVENOUS | Status: DC | PRN
  Administered 2019-02-03: 30 ug/min via INTRAVENOUS

## 2019-02-03 MED ORDER — BUPIVACAINE HCL (PF) 0.25 % IJ SOLN
INTRAMUSCULAR | Status: AC
  Filled 2019-02-03: qty 120

## 2019-02-03 MED ORDER — DORZOLAMIDE HCL-TIMOLOL MAL 2-0.5 % OP SOLN
1.0000 [drp] | Freq: Two times a day (BID) | OPHTHALMIC | Status: DC
  Administered 2019-02-03 – 2019-02-05 (×4): 1 [drp] via OPHTHALMIC
  Filled 2019-02-03: qty 10

## 2019-02-03 MED ORDER — DEXAMETHASONE SODIUM PHOSPHATE 10 MG/ML IJ SOLN
INTRAMUSCULAR | Status: AC
  Filled 2019-02-03: qty 1

## 2019-02-03 MED ORDER — ACETAMINOPHEN 325 MG PO TABS: 325.0000 mg | ORAL_TABLET | Freq: Four times a day (QID) | ORAL | Status: DC | PRN

## 2019-02-03 MED ORDER — PROPOFOL 10 MG/ML IV BOLUS
INTRAVENOUS | Status: AC
  Filled 2019-02-03: qty 20

## 2019-02-03 MED ORDER — BUPIVACAINE HCL (PF) 0.5 % IJ SOLN
INTRAMUSCULAR | Status: DC | PRN
  Administered 2019-02-03: 3 mL

## 2019-02-03 MED ORDER — ONDANSETRON HCL 4 MG/2ML IJ SOLN
INTRAMUSCULAR | Status: AC
  Filled 2019-02-03: qty 2

## 2019-02-03 MED ORDER — GABAPENTIN 300 MG PO CAPS
300.0000 mg | ORAL_CAPSULE | Freq: Every day | ORAL | Status: DC
  Administered 2019-02-03 – 2019-02-04 (×2): 300 mg via ORAL
  Filled 2019-02-03 (×2): qty 1

## 2019-02-03 MED ORDER — DEXAMETHASONE SODIUM PHOSPHATE 10 MG/ML IJ SOLN
8.0000 mg | Freq: Once | INTRAMUSCULAR | Status: AC
  Administered 2019-02-03: 07:00:00 8 mg via INTRAVENOUS

## 2019-02-03 MED ORDER — HYDROCHLOROTHIAZIDE 25 MG PO TABS
25.0000 mg | ORAL_TABLET | Freq: Every day | ORAL | Status: DC
  Administered 2019-02-05: 25 mg via ORAL
  Filled 2019-02-03: qty 1

## 2019-02-03 MED ORDER — CELECOXIB 200 MG PO CAPS
400.0000 mg | ORAL_CAPSULE | Freq: Once | ORAL | Status: AC
  Administered 2019-02-03: 400 mg via ORAL

## 2019-02-03 MED ORDER — SODIUM CHLORIDE (PF) 0.9 % IJ SOLN
INTRAMUSCULAR | Status: AC
  Filled 2019-02-03: qty 10

## 2019-02-03 MED ORDER — ENOXAPARIN SODIUM 30 MG/0.3ML ~~LOC~~ SOLN
30.0000 mg | Freq: Two times a day (BID) | SUBCUTANEOUS | Status: DC
  Administered 2019-02-04 – 2019-02-05 (×3): 30 mg via SUBCUTANEOUS
  Filled 2019-02-03 (×3): qty 0.3

## 2019-02-03 MED ORDER — CELECOXIB 200 MG PO CAPS
200.0000 mg | ORAL_CAPSULE | Freq: Two times a day (BID) | ORAL | Status: DC
  Administered 2019-02-03 – 2019-02-05 (×4): 200 mg via ORAL
  Filled 2019-02-03 (×4): qty 1

## 2019-02-03 MED ORDER — OXYCODONE HCL 5 MG PO TABS
5.0000 mg | ORAL_TABLET | ORAL | Status: DC | PRN
  Administered 2019-02-03 – 2019-02-05 (×2): 5 mg via ORAL
  Filled 2019-02-03 (×4): qty 1

## 2019-02-03 MED ORDER — PHENYLEPHRINE HCL (PRESSORS) 10 MG/ML IV SOLN
INTRAVENOUS | Status: AC
  Filled 2019-02-03: qty 1

## 2019-02-03 MED ORDER — CHLORHEXIDINE GLUCONATE 4 % EX LIQD: 60.0000 mL | Freq: Once | CUTANEOUS | Status: DC

## 2019-02-03 MED ORDER — METOCLOPRAMIDE HCL 10 MG PO TABS
10.0000 mg | ORAL_TABLET | Freq: Three times a day (TID) | ORAL | Status: DC
  Administered 2019-02-03 – 2019-02-05 (×7): 10 mg via ORAL
  Filled 2019-02-03 (×7): qty 1

## 2019-02-03 MED ORDER — LIDOCAINE HCL (PF) 2 % IJ SOLN
INTRAMUSCULAR | Status: AC
  Filled 2019-02-03: qty 10

## 2019-02-03 MED ORDER — ACETAMINOPHEN 10 MG/ML IV SOLN
INTRAVENOUS | Status: AC
  Filled 2019-02-03: qty 100

## 2019-02-03 MED ORDER — ONDANSETRON HCL 4 MG/2ML IJ SOLN: 4.0000 mg | Freq: Four times a day (QID) | INTRAMUSCULAR | Status: DC | PRN

## 2019-02-03 MED ORDER — MAGNESIUM HYDROXIDE 400 MG/5ML PO SUSP
30.0000 mL | Freq: Every day | ORAL | Status: DC
  Administered 2019-02-04: 30 mL via ORAL
  Filled 2019-02-03: qty 30

## 2019-02-03 MED ORDER — PANTOPRAZOLE SODIUM 40 MG PO TBEC
40.0000 mg | DELAYED_RELEASE_TABLET | Freq: Two times a day (BID) | ORAL | Status: DC
  Administered 2019-02-03 – 2019-02-05 (×4): 40 mg via ORAL
  Filled 2019-02-03 (×4): qty 1

## 2019-02-03 MED ORDER — SENNOSIDES-DOCUSATE SODIUM 8.6-50 MG PO TABS
1.0000 | ORAL_TABLET | Freq: Two times a day (BID) | ORAL | Status: DC
  Administered 2019-02-03 – 2019-02-05 (×4): 1 via ORAL
  Filled 2019-02-03 (×4): qty 1

## 2019-02-03 MED ORDER — CELECOXIB 200 MG PO CAPS
ORAL_CAPSULE | ORAL | Status: AC
  Filled 2019-02-03: qty 2

## 2019-02-03 MED ORDER — ALUM & MAG HYDROXIDE-SIMETH 200-200-20 MG/5ML PO SUSP: 30.0000 mL | ORAL | Status: DC | PRN

## 2019-02-03 MED ORDER — METOCLOPRAMIDE HCL 5 MG/ML IJ SOLN: 5.0000 mg | Freq: Three times a day (TID) | INTRAMUSCULAR | Status: DC | PRN

## 2019-02-03 MED ORDER — AMLODIPINE BESYLATE 10 MG PO TABS
10.0000 mg | ORAL_TABLET | Freq: Every day | ORAL | Status: DC
  Administered 2019-02-05: 10 mg via ORAL
  Filled 2019-02-03: qty 1

## 2019-02-03 MED ORDER — METOCLOPRAMIDE HCL 10 MG PO TABS: 5.0000 mg | ORAL_TABLET | Freq: Three times a day (TID) | ORAL | Status: DC | PRN

## 2019-02-03 MED ORDER — NEOMYCIN-POLYMYXIN B GU 40-200000 IR SOLN
Status: DC | PRN
  Administered 2019-02-03: 14 mL

## 2019-02-03 MED ORDER — SODIUM CHLORIDE FLUSH 0.9 % IV SOLN
INTRAVENOUS | Status: AC
  Filled 2019-02-03: qty 80

## 2019-02-03 MED ORDER — GABAPENTIN 300 MG PO CAPS
ORAL_CAPSULE | ORAL | Status: AC
  Filled 2019-02-03: qty 1

## 2019-02-03 SURGICAL SUPPLY — 69 items
ATTUNE MED DOME PAT 41 KNEE (Knees) ×2 IMPLANT
ATTUNE MED DOME PAT 41MM KNEE (Knees) ×1 IMPLANT
ATTUNE PS FEM RT SZ 7 CEM KNEE (Femur) ×3 IMPLANT
ATTUNE PSRP INSR SZ7 6 KNEE (Insert) ×2 IMPLANT
ATTUNE PSRP INSR SZ7 6MM KNEE (Insert) ×1 IMPLANT
BASE TIBIAL ATTUNE KNEE SZ9 (Knees) ×1 IMPLANT
BATTERY INSTRU NAVIGATION (MISCELLANEOUS) ×12 IMPLANT
BLADE SAW 70X12.5 (BLADE) ×3 IMPLANT
BLADE SAW 90X13X1.19 OSCILLAT (BLADE) ×3 IMPLANT
BLADE SAW 90X25X1.19 OSCILLAT (BLADE) ×3 IMPLANT
CANISTER SUCT 3000ML PPV (MISCELLANEOUS) ×3 IMPLANT
CEMENT HV SMART SET (Cement) ×6 IMPLANT
COOLER POLAR GLACIER W/PUMP (MISCELLANEOUS) ×3 IMPLANT
COVER WAND RF STERILE (DRAPES) ×3 IMPLANT
CUFF TOURN SGL QUICK 24 (TOURNIQUET CUFF)
CUFF TOURN SGL QUICK 30 (TOURNIQUET CUFF) ×2
CUFF TRNQT CYL 24X4X16.5-23 (TOURNIQUET CUFF) IMPLANT
CUFF TRNQT CYL 30X4X21-28X (TOURNIQUET CUFF) ×1 IMPLANT
DRAPE SHEET LG 3/4 BI-LAMINATE (DRAPES) ×3 IMPLANT
DRSG DERMACEA 8X12 NADH (GAUZE/BANDAGES/DRESSINGS) ×3 IMPLANT
DRSG OPSITE POSTOP 4X14 (GAUZE/BANDAGES/DRESSINGS) ×3 IMPLANT
DRSG TEGADERM 4X4.75 (GAUZE/BANDAGES/DRESSINGS) ×3 IMPLANT
DURAPREP 26ML APPLICATOR (WOUND CARE) ×6 IMPLANT
ELECT REM PT RETURN 9FT ADLT (ELECTROSURGICAL) ×3
ELECTRODE REM PT RTRN 9FT ADLT (ELECTROSURGICAL) ×1 IMPLANT
EX-PIN ORTHOLOCK NAV 4X150 (PIN) ×6 IMPLANT
GLOVE BIOGEL M STRL SZ7.5 (GLOVE) ×6 IMPLANT
GLOVE INDICATOR 8.0 STRL GRN (GLOVE) ×3 IMPLANT
GOWN STRL REUS W/ TWL LRG LVL3 (GOWN DISPOSABLE) ×2 IMPLANT
GOWN STRL REUS W/TWL LRG LVL3 (GOWN DISPOSABLE) ×4
HEMOVAC 400CC 10FR (MISCELLANEOUS) ×3 IMPLANT
HOLDER FOLEY CATH W/STRAP (MISCELLANEOUS) ×3 IMPLANT
HOOD PEEL AWAY FLYTE STAYCOOL (MISCELLANEOUS) ×9 IMPLANT
KIT TURNOVER KIT A (KITS) ×3 IMPLANT
KNIFE SCULPS 14X20 (INSTRUMENTS) ×3 IMPLANT
LABEL OR SOLS (LABEL) ×3 IMPLANT
MANIFOLD NEPTUNE WASTE (CANNULA) ×3 IMPLANT
NDL SAFETY ECLIPSE 18X1.5 (NEEDLE) ×1 IMPLANT
NEEDLE HYPO 18GX1.5 SHARP (NEEDLE) ×2
NEEDLE SPNL 20GX3.5 QUINCKE YW (NEEDLE) ×6 IMPLANT
NS IRRIG 500ML POUR BTL (IV SOLUTION) ×3 IMPLANT
PACK TOTAL KNEE (MISCELLANEOUS) ×3 IMPLANT
PAD WRAPON POLAR KNEE (MISCELLANEOUS) ×1 IMPLANT
PENCIL SMOKE ULTRAEVAC 22 CON (MISCELLANEOUS) ×3 IMPLANT
PIN DRILL QUICK PACK ×3 IMPLANT
PIN FIXATION 1/8DIA X 3INL (PIN) ×9 IMPLANT
PULSAVAC PLUS IRRIG FAN TIP (DISPOSABLE) ×3
SOL .9 NS 3000ML IRR  AL (IV SOLUTION) ×2
SOL .9 NS 3000ML IRR UROMATIC (IV SOLUTION) ×1 IMPLANT
SOL PREP PVP 2OZ (MISCELLANEOUS) ×3
SOLUTION PREP PVP 2OZ (MISCELLANEOUS) ×1 IMPLANT
SPONGE DRAIN TRACH 4X4 STRL 2S (GAUZE/BANDAGES/DRESSINGS) ×3 IMPLANT
SPONGE LAP 18X18 RF (DISPOSABLE) ×3 IMPLANT
STAPLER SKIN PROX 35W (STAPLE) ×3 IMPLANT
STOCKINETTE IMPERV 14X48 (MISCELLANEOUS) IMPLANT
STRAP TIBIA SHORT (MISCELLANEOUS) ×3 IMPLANT
SUCTION FRAZIER HANDLE 10FR (MISCELLANEOUS) ×2
SUCTION TUBE FRAZIER 10FR DISP (MISCELLANEOUS) ×1 IMPLANT
SUT VIC AB 0 CT1 36 (SUTURE) ×3 IMPLANT
SUT VIC AB 1 CT1 36 (SUTURE) ×6 IMPLANT
SUT VIC AB 2-0 CT2 27 (SUTURE) ×3 IMPLANT
SYR 20CC LL (SYRINGE) ×3 IMPLANT
SYR 30ML LL (SYRINGE) ×6 IMPLANT
TIBIAL BASE ATTUNE KNEE SZ9 (Knees) ×3 IMPLANT
TIP FAN IRRIG PULSAVAC PLUS (DISPOSABLE) ×1 IMPLANT
TOWEL OR 17X26 4PK STRL BLUE (TOWEL DISPOSABLE) ×3 IMPLANT
TOWER CARTRIDGE SMART MIX (DISPOSABLE) ×3 IMPLANT
TRAY FOLEY MTR SLVR 16FR STAT (SET/KITS/TRAYS/PACK) ×3 IMPLANT
WRAPON POLAR PAD KNEE (MISCELLANEOUS) ×3

## 2019-02-03 NOTE — TOC Progression Note (Signed)
Transition of Care [facility]) - Progression Note    Patient Details  Name: Nathan Merritt MRN: [id number] Date of Birth: 03/21/1946  Transition [facility]) CM/SW Contact  Su Hilt, RN Phone Number: 02/03/2019, 2:39 PM  Clinical Narrative:      Lucile Shutters with [facility] that the Patient has arrived after surgery and that the Physician (surgeon) office setup [facility] services with [facility] prior to surgery.        Expected Discharge Plan and Services                                                 Social Determinants of Health (SDOH) Interventions    Readmission Risk Interventions No flowsheet data found.

## 2019-02-03 NOTE — Anesthesia Procedure Notes (Signed)
Date/Time: 02/03/2019 8:00 AM Performed by: Caryl Asp, CRNA Pre-anesthesia Checklist: Patient identified, Emergency Drugs available, Suction available, Patient being monitored and Timeout performed Oxygen Delivery Method: Simple face mask

## 2019-02-03 NOTE — Anesthesia Procedure Notes (Addendum)
Spinal  Patient location during procedure: OR End time: 02/03/2019 7:26 AM Staffing Anesthesiologist: Emmie Niemann, MD Resident/CRNA: Caryl Asp, CRNA Performed: anesthesiologist  Preanesthetic Checklist Completed: patient identified, site marked, surgical consent, pre-op evaluation, timeout performed, IV checked, risks and benefits discussed and monitors and equipment checked Spinal Block Patient position: sitting Prep: DuraPrep Patient monitoring: heart rate, cardiac monitor, continuous pulse ox and blood pressure Approach: midline Location: L3-4 Injection technique: single-shot Needle Needle type: Sprotte  Needle gauge: 24 G Needle length: 9 cm Assessment Sensory level: T4

## 2019-02-03 NOTE — TOC Benefit Eligibility Note (Signed)
Transition of Care [facility]) Benefit Eligibility Note    Patient Details  Name: Nathan Merritt MRN: [id number] Date of Birth: [birth year]   Medication/Dose: Lovenox '40mg'$  once daily for 14 days  Covered?: No   Prescription Coverage Preferred Pharmacy: Any retail pharmacy  Spoke with Person/Company/Phone Number:: [place] with [facility] Medicare at [phone]  Prior Approval: Yes(PA required for name brand: [phone])  Deductible: Unmet(Deductible of $265.  $33.49 met as of time of call.)  Additional Notes: Generic Enoxaparin covered with no PA required.  Considered Tier 4.  Estimated copay $317.48.  Rep explained copay high due to deductible not being met.    Dannette Barbara Phone Number: [phone] or [phone] 02/03/2019, 11:57 AM

## 2019-02-03 NOTE — TOC Progression Note (Signed)
Transition of Care [facility]) - Progression Note    Patient Details  Name: Nathan Merritt MRN: [id number] Date of Birth: May 22, 1946  Transition of Care [facility]) CM/SW [place], RN Phone Number: 02/03/2019, 10:14 AM  Clinical Narrative:    Lovenox Price check requested, Will notify the patient of cost once obtained        Expected Discharge Plan and Services                                                 Social Determinants of Health (SDOH) Interventions    Readmission Risk Interventions No flowsheet data found.

## 2019-02-03 NOTE — NC FL2 (Signed)
[place] LEVEL OF CARE SCREENING TOOL     IDENTIFICATION  Patient Name: Nathan Merritt Birthdate: [birth year] Sex: male Admission Date (Current Location): 02/03/2019  [place] and [place] Number:  Engineering geologist and Address:  [facility], [address] [place], [place] [address]      Provider Number: [id number]  Attending Physician Name and Address:  Dereck Leep, MD  Relative Name and Phone Number:  Oracio Galen Daughter [phone]    Current Level of Care: Hospital Recommended Level of Care: [place] Prior Approval Number:    Date Approved/Denied: 02/03/19 PASRR Number: [id number]  Discharge Plan: SNF    Current Diagnoses: Patient Active Problem List   Diagnosis Date Noted  . 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 Primary osteoarthritis of both knees 09/29/2018  . 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 Male erectile dysfunction, unspecified 02/05/2015  . Benign prostatic hyperplasia 06/10/2014  . Hyperlipidemia 06/10/2014  . Hypertension 06/10/2014  . 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 Lens replaced 07/31/2012  . Acute angle-closure glaucoma 05/27/2012    Orientation RESPIRATION BLADDER Height & Weight     Self, Time, Situation, Place    Continent Weight: 108 kg Height:  6\' 3"  (190.5 cm)  BEHAVIORAL SYMPTOMS/MOOD NEUROLOGICAL BOWEL NUTRITION STATUS      Continent Diet(regular)  AMBULATORY STATUS COMMUNICATION OF NEEDS Skin   Extensive Assist Verbally Normal, Surgical wounds                       Personal Care Assistance Level of Assistance  Bathing, Dressing Bathing Assistance: Limited assistance   Dressing Assistance: Limited assistance     Functional Limitations Info  Sight, Hearing, Speech Sight Info: Adequate Hearing Info: Adequate Speech Info: Adequate    SPECIAL CARE FACTORS FREQUENCY  PT (By licensed PT)     PT Frequency: 5 times per  week              Contractures Contractures Info: Not present    Additional Factors Info  Allergies   Allergies Info: Acetazolamide           Current Medications (02/03/2019):  This is the current hospital active medication list Current Facility-Administered Medications  Medication Dose Route Frequency Provider Last Rate Last Dose  . 0.9 %  sodium chloride infusion   Intravenous Continuous Hooten, Laurice Record, MD 100 mL/hr at 02/03/19 1426    . acetaminophen (OFIRMEV) IV 1,000 mg  1,000 mg Intravenous Q6H Hooten, Laurice Record, MD      . Derrill Memo ON 02/04/2019] acetaminophen (TYLENOL) tablet 325-650 mg  325-650 mg Oral Q6H PRN Hooten, Laurice Record, MD      . alum & mag hydroxide-simeth (MAALOX/MYLANTA) 200-200-20 MG/5ML suspension 30 mL  30 mL Oral Q4H PRN Hooten, Laurice Record, MD      . Derrill Memo ON 02/04/2019] amLODipine (NORVASC) tablet 10 mg  10 mg Oral Daily Hooten, Laurice Record, MD      . bisacodyl (DULCOLAX) suppository 10 mg  10 mg Rectal Daily PRN Hooten, Laurice Record, MD      . ceFAZolin (ANCEF) IVPB 2g/100 mL premix  2 g Intravenous Q6H Hooten, Laurice Record, MD 200 mL/hr at 02/03/19 1432 2 g at 02/03/19 1432  . celecoxib (CELEBREX) 200 MG capsule           . celecoxib (CELEBREX) capsule 200 mg  200 mg Oral BID Hooten, Laurice Record, MD      .  dexamethasone (DECADRON) 10 MG/ML injection           . diphenhydrAMINE (BENADRYL) 12.5 MG/5ML elixir 12.5-25 mg  12.5-25 mg Oral Q4H PRN Hooten, Laurice Record, MD      . dorzolamide-timolol (COSOPT) 22.3-6.8 MG/ML ophthalmic solution 1 drop  1 drop Both Eyes BID Hooten, Laurice Record, MD      . Derrill Memo ON 02/04/2019] enoxaparin (LOVENOX) injection 30 mg  30 mg Subcutaneous Q12H Hooten, Laurice Record, MD      . famotidine (PEPCID) 20 MG tablet           . ferrous sulfate tablet 325 mg  325 mg Oral BID WC Hooten, Laurice Record, MD      . gabapentin (NEURONTIN) 300 MG capsule           . gabapentin (NEURONTIN) capsule 300 mg  300 mg Oral QHS Hooten, Laurice Record, MD      . Derrill Memo ON 02/04/2019]  hydrochlorothiazide (HYDRODIURIL) tablet 25 mg  25 mg Oral Daily Hooten, Laurice Record, MD      . HYDROmorphone (DILAUDID) injection 0.5-1 mg  0.5-1 mg Intravenous Q4H PRN Hooten, Laurice Record, MD      . magnesium hydroxide (MILK OF MAGNESIA) suspension 30 mL  30 mL Oral Daily Hooten, Laurice Record, MD      . menthol-cetylpyridinium (CEPACOL) lozenge 3 mg  1 lozenge Oral PRN Hooten, Laurice Record, MD       Or  . phenol (CHLORASEPTIC) mouth spray 1 spray  1 spray Mouth/Throat PRN Hooten, Laurice Record, MD      . metoCLOPramide (REGLAN) tablet 5-10 mg  5-10 mg Oral Q8H PRN Hooten, Laurice Record, MD       Or  . metoCLOPramide (REGLAN) injection 5-10 mg  5-10 mg Intravenous Q8H PRN Hooten, Laurice Record, MD      . metoCLOPramide (REGLAN) tablet 10 mg  10 mg Oral TID AC & HS Hooten, Laurice Record, MD      . ondansetron (ZOFRAN) tablet 4 mg  4 mg Oral Q6H PRN Hooten, Laurice Record, MD       Or  . ondansetron (ZOFRAN) injection 4 mg  4 mg Intravenous Q6H PRN Hooten, Laurice Record, MD      . oxyCODONE (Oxy IR/ROXICODONE) immediate release tablet 10 mg  10 mg Oral Q4H PRN Hooten, Laurice Record, MD      . oxyCODONE (Oxy IR/ROXICODONE) immediate release tablet 5 mg  5 mg Oral Q4H PRN Hooten, Laurice Record, MD      . pantoprazole (PROTONIX) EC tablet 40 mg  40 mg Oral BID Hooten, Laurice Record, MD      . Derrill Memo ON 02/04/2019] prednisoLONE acetate (PRED FORTE) 1 % ophthalmic suspension 1 drop  1 drop Right Eye Daily Hooten, Laurice Record, MD      . senna-docusate (Senokot-S) tablet 1 tablet  1 tablet Oral BID Hooten, Laurice Record, MD      . sodium phosphate (FLEET) 7-19 GM/118ML enema 1 enema  1 enema Rectal Once PRN Hooten, Laurice Record, MD      . traMADol Veatrice Bourbon) tablet 50-100 mg  50-100 mg Oral Q4H PRN Hooten, Laurice Record, MD         Discharge Medications: Please see discharge summary for a list of discharge medications.  Relevant Imaging Results:  Relevant Lab Results:   Additional Information [id number]  Su Hilt, RN

## 2019-02-03 NOTE — Evaluation (Signed)
Physical Therapy Evaluation Patient Details Name: Nathan Merritt MRN: [id number] DOB: [birth year] Today's Date: 02/03/2019   History of Present Illness  Pt is a 73 y.o. male s/p R TKA secondary degenerative arthrosis 02/03/19.  PMH includes htn, neutropenia, and BPH.  Clinical Impression  Prior to R TKA, pt was modified independent with ambulation using SPC.  Pt lives alone in 1 level home with 1 step (B railings) to enter.  Currently pt is SBA semi-supine to sit; mod assist to stand from bed; and CGA to walk a few feet bed to recliner with RW.  Pt able to perform R LE SLR independently and R knee flexion AROM to 105 degrees (R knee extension 8 degrees short of neutral).  Pain 5-6/10 R knee end of session (nurse notified of pt's request for pain meds).  Pt would benefit from skilled PT to address noted impairments and functional limitations (see below for any additional details).  Upon hospital discharge, recommend pt discharge with HHPT.    Follow Up Recommendations Home health PT    Equipment Recommendations  Rolling walker with 5" wheels;3in1 (PT)    Recommendations for Other Services OT consult     Precautions / Restrictions Precautions Precautions: Knee;Fall Precaution Booklet Issued: No Required Braces or Orthoses: Knee Immobilizer - Right Knee Immobilizer - Right: Discontinue once straight leg raise with < 10 degree lag Restrictions Weight Bearing Restrictions: Yes RLE Weight Bearing: Weight bearing as tolerated      Mobility  Bed Mobility Overal bed mobility: Needs Assistance Bed Mobility: Supine to Sit     Supine to sit: Supervision;HOB elevated     General bed mobility comments: assist for lines  Transfers Overall transfer level: Needs assistance Equipment used: Rolling walker (2 wheeled) Transfers: Sit to/from Stand Sit to Stand: Mod assist         General transfer comment: assist to initiate and come to full stand up to RW (from bed in lowest position);  inital vc's for UE/LE placement  Ambulation/Gait Ambulation/Gait assistance: Min guard Gait Distance (Feet): 3 Feet(bed to recliner) Assistive device: Rolling walker (2 wheeled) Gait Pattern/deviations: Antalgic;Decreased stance time - right;Step-to pattern Gait velocity: decreased   General Gait Details: steady with RW; vc's for correct walker use required  Stairs            Wheelchair Mobility    Modified Rankin (Stroke Patients Only)       Balance Overall balance assessment: Needs assistance Sitting-balance support: No upper extremity supported;Feet supported Sitting balance-Leahy Scale: Good Sitting balance - Comments: steady sitting reaching within BOS   Standing balance support: Single extremity supported Standing balance-Leahy Scale: Fair Standing balance comment: steady standing with at least single UE support                             Pertinent Vitals/Pain Pain Assessment: 0-10 Pain Score: 6  Pain Location: R knee Pain Descriptors / Indicators: Aching;Sore;Tender Pain Intervention(s): Limited activity within patient's tolerance;Monitored during session;Premedicated before session;Repositioned;Patient requesting pain meds-RN notified;Other (comment)(polar care activated)  Vitals (HR and O2 on room air) stable and WFL throughout treatment session.    Home Living Family/patient expects to be discharged to:: Private residence Living Arrangements: Alone   Type of Home: House Home Access: Stairs to enter Entrance Stairs-Rails: Right;Left;Can reach both Entrance Stairs-Number of Steps: 1 Home Layout: One level Home Equipment: Shower seat      Prior Function Level of Independence: Independent with assistive  device(s)         Comments: Ambulatory with SPC; pt reports no falls in past 6 months.     Hand Dominance        Extremity/Trunk Assessment   Upper Extremity Assessment Upper Extremity Assessment: Overall WFL for tasks  assessed    Lower Extremity Assessment Lower Extremity Assessment: RLE deficits/detail;LLE deficits/detail RLE Deficits / Details: able to perform R LE SLR independently; good R quad set; DF/PF at least 3/5 AROM LLE Deficits / Details: strength and ROM WFL    Cervical / Trunk Assessment Cervical / Trunk Assessment: Normal  Communication   Communication: No difficulties  Cognition Arousal/Alertness: Awake/alert Behavior During Therapy: WFL for tasks assessed/performed Overall Cognitive Status: Within Functional Limits for tasks assessed                                        General Comments General comments (skin integrity, edema, etc.): R knee bandages and hemovac in place.  Pt agreeable to PT session.    Exercises Total Joint Exercises Ankle Circles/Pumps: AROM;Strengthening;Both;10 reps;Supine Quad Sets: AROM;Strengthening;Both;10 reps;Supine Heel Slides: AAROM;Strengthening;Right;10 reps;Supine Hip ABduction/ADduction: AAROM;Strengthening;Right;10 reps;Supine Goniometric ROM: R knee extension AROM 8 degrees short of neutral semi-supine in bed; R knee flexion AROM to 105 degrees sitting in recliner   Assessment/Plan    PT Assessment Patient needs continued PT services  PT Problem List Decreased strength;Decreased range of motion;Decreased activity tolerance;Decreased balance;Decreased mobility;Decreased knowledge of use of DME;Decreased knowledge of precautions;Pain;Decreased skin integrity       PT Treatment Interventions DME instruction;Gait training;Stair training;Functional mobility training;Therapeutic activities;Therapeutic exercise;Balance training;Patient/family education    PT Goals (Current goals can be found in the Care Plan section)  Acute Rehab PT Goals Patient Stated Goal: to be able to walk PT Goal Formulation: With patient Time For Goal Achievement: 02/17/19 Potential to Achieve Goals: Good    Frequency BID   Barriers to discharge         Co-evaluation               AM-PAC PT "6 Clicks" Mobility  Outcome Measure Help needed turning from your back to your side while in a flat bed without using bedrails?: A Little Help needed moving from lying on your back to sitting on the side of a flat bed without using bedrails?: A Little Help needed moving to and from a bed to a chair (including a wheelchair)?: A Little Help needed standing up from a chair using your arms (e.g., wheelchair or bedside chair)?: A Lot Help needed to walk in hospital room?: A Little Help needed climbing 3-5 steps with a railing? : A Lot 6 Click Score: 16    End of Session Equipment Utilized During Treatment: Gait belt Activity Tolerance: Patient tolerated treatment well Patient left: in chair;with call bell/phone within reach;with chair alarm set;with SCD's reapplied(B heels elevated via towel rolls; polar care in place and activated) Nurse Communication: Mobility status;Patient requests pain meds;Precautions;Weight bearing status PT Visit Diagnosis: Other abnormalities of gait and mobility (R26.89);Muscle weakness (generalized) (M62.81);Difficulty in walking, not elsewhere classified (R26.2);Pain Pain - Right/Left: Right Pain - part of body: Knee    Time: 1550-1630 PT Time Calculation (min) (ACUTE ONLY): 40 min   Charges:   PT Evaluation $PT Eval Low Complexity: 1 Low PT Treatments $Therapeutic Exercise: 8-22 mins $Therapeutic Activity: 8-22 mins       Leitha Bleak, PT 02/03/19, 5:43  PM [phone]

## 2019-02-03 NOTE — H&P (Signed)
The patient has been re-examined, and the chart reviewed, and there have been no interval changes to the documented history and physical.    The risks, benefits, and alternatives have been discussed at length. The patient expressed understanding of the risks benefits and agreed with plans for surgical intervention.  James P. Hooten, Jr. M.D.    

## 2019-02-03 NOTE — Transfer of Care (Signed)
Immediate Anesthesia Transfer of Care Note  Patient: Nathan Merritt  Procedure(s) Performed: COMPUTER ASSISTED TOTAL KNEE ARTHROPLASTY RIGHT (Right Knee)  Patient Location: PACU  Anesthesia Type:Spinal  Level of Consciousness: sedated  Airway & Oxygen Therapy: Patient Spontanous Breathing and Patient connected to face mask oxygen  Post-op Assessment: Report given to RN and Post -op Vital signs reviewed and stable  Post vital signs: Reviewed and stable  Last Vitals:  Vitals Value Taken Time  BP    Temp    Pulse    Resp    SpO2      Last Pain:  Vitals:   02/03/19 0629  TempSrc: Tympanic  PainSc: 0-No pain         Complications: No apparent anesthesia complications

## 2019-02-03 NOTE — Anesthesia Post-op Follow-up Note (Signed)
Anesthesia QCDR form completed.        

## 2019-02-03 NOTE — TOC Initial Note (Signed)
Transition of Care [facility]) - Initial/Assessment Note    Patient Details  Name: Nathan Merritt MRN: [id number] Date of Birth: [birth year]  [facility]) CM/SW Contact:    Su Hilt, RN Phone Number: 02/03/2019, 3:48 PM  Clinical Narrative:                 Met with the patient to discuss the DC plan and needs, He states that he plans to go to rehab if able due to living alone and having no assistance.  He has no DME at home \.  He gives permission for me to start the Bed search process with an FL2 and PASSR, PASSR obtained and FL2 submitted thru the hub in search of a bed offer, once bed offers obtained will review in detail with the patient to discuss choice.  Expected Discharge Plan: Skilled Nursing Facility Barriers to Discharge: Continued Medical Work up   Patient Goals and CMS Choice        Expected Discharge Plan and Services Expected Discharge Plan: [place] arrangements for the past 2 months: Single Family Home                                      Prior Living Arrangements/Services Living arrangements for the past 2 months: Single Family Home Lives with:: Self Patient language and need for interpreter reviewed:: No Do you feel safe going back to the place where you live?: Yes      Need for Family Participation in Patient Care: No (Comment) Care giver support system in place?: Yes (comment)   Criminal Activity/Legal Involvement Pertinent to Current Situation/Hospitalization: No - Comment as needed  Activities of Daily Living Home Assistive Devices/Equipment: Cane (specify quad or straight) ADL Screening (condition at time of admission) Patient's cognitive ability adequate to safely complete daily activities?: Yes Is the patient deaf or have difficulty hearing?: No Does the patient have difficulty seeing, even when wearing glasses/contacts?: No Does the patient have difficulty concentrating, remembering, or making  decisions?: No Patient able to express need for assistance with ADLs?: Yes Does the patient have difficulty dressing or bathing?: No Independently performs ADLs?: Yes (appropriate for developmental age) Does the patient have difficulty walking or climbing stairs?: Yes Weakness of Legs: Right Weakness of Arms/Hands: None  Permission Sought/Granted Permission sought to share information with : Case Manager, Customer service manager Permission granted to share information with : Yes, Verbal Permission Granted     Permission granted to share info w AGENCY: Home health Agency or SNF        Emotional Assessment Appearance:: Appears stated age Attitude/Demeanor/Rapport: Engaged Affect (typically observed): Accepting Orientation: : Oriented to Self, Oriented to Place, Oriented to  Time, Oriented to Situation Alcohol / Substance Use: Not Applicable Psych Involvement: No (comment)  Admission diagnosis:  Total knee replacement status [Z96.659] Patient Active Problem List   Diagnosis Date Noted  . 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 Primary osteoarthritis of both knees 09/29/2018  . 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 Male erectile dysfunction, unspecified 02/05/2015  . Benign prostatic hyperplasia 06/10/2014  . Hyperlipidemia 06/10/2014  . Hypertension 06/10/2014  . 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 Lens replaced 07/31/2012  . Acute angle-closure glaucoma 05/27/2012   PCP:  Baxter Hire, MD Pharmacy:   [id number] [place]  [place] [address] Phone: [phone] Fax: [phone]     Social Determinants of Health (SDOH) Interventions    Readmission Risk Interventions No flowsheet data found.

## 2019-02-03 NOTE — Anesthesia Preprocedure Evaluation (Signed)
Anesthesia Evaluation  Patient identified by MRN, date of birth, ID band Patient awake    Reviewed: Allergy & Precautions, NPO status , Patient's Chart, lab work & pertinent test results  History of Anesthesia Complications Negative for: history of anesthetic complications  Airway Mallampati: II  TM Distance: >3 FB Neck ROM: Full    Dental  (+) Missing   Pulmonary neg pulmonary ROS, neg sleep apnea, neg COPD,    breath sounds clear to auscultation- rhonchi (-) wheezing      Cardiovascular hypertension, Pt. on medications (-) CAD, (-) Past MI, (-) Cardiac Stents and (-) CABG  Rhythm:Regular Rate:Normal - Systolic murmurs and - Diastolic murmurs    Neuro/Psych neg Seizures negative neurological ROS  negative psych ROS   GI/Hepatic negative GI ROS, Neg liver ROS,   Endo/Other  negative endocrine ROSneg diabetes  Renal/GU negative Renal ROS     Musculoskeletal  (+) Arthritis ,   Abdominal (+) - obese,   Peds  Hematology negative hematology ROS (+)   Anesthesia Other Findings Past Medical History: No date: Arthritis No date: BPH (benign prostatic hyperplasia) No date: Glaucoma No date: Glaucoma No date: Hyperlipidemia No date: Hypertension No date: Neutropenia (HCC)   Reproductive/Obstetrics                             Lab Results  Component Value Date   WBC 4.2 01/30/2019   HGB 14.6 02/03/2019   HCT 43.0 02/03/2019   MCV 87.6 01/30/2019   PLT 221 01/30/2019    Anesthesia Physical Anesthesia Plan  ASA: II  Anesthesia Plan: Spinal   Post-op Pain Management:    Induction:   PONV Risk Score and Plan: 1 and Propofol infusion  Airway Management Planned: Natural Airway  Additional Equipment:   Intra-op Plan:   Post-operative Plan:   Informed Consent: I have reviewed the patients History and Physical, chart, labs and discussed the procedure including the risks, benefits  and alternatives for the proposed anesthesia with the patient or authorized representative who has indicated his/her understanding and acceptance.     Dental advisory given  Plan Discussed with: CRNA and Anesthesiologist  Anesthesia Plan Comments:         Anesthesia Quick Evaluation

## 2019-02-03 NOTE — Op Note (Addendum)
OPERATIVE NOTE  DATE OF SURGERY:  02/03/2019  PATIENT NAME:  Nathan Merritt   DOB: 1946/01/10  MRN: [id number]  PRE-OPERATIVE DIAGNOSIS: Degenerative arthrosis of the right knee, primary  POST-OPERATIVE DIAGNOSIS:  Same  PROCEDURE:  Right total knee arthroplasty using computer-assisted navigation  SURGEON:  Marciano Sequin. M.D.  ASSISTANT: Maximino Greenland, RN (present and scrubbed throughout the case, critical for assistance with exposure, retraction, instrumentation, and closure)  ANESTHESIA: spinal  ESTIMATED BLOOD LOSS: 50 mL  FLUIDS REPLACED: 1300 mL of crystalloid  TOURNIQUET TIME: 125 minutes  DRAINS: 2 medium Hemovac drains  SOFT TISSUE RELEASES: Anterior cruciate ligament, posterior cruciate ligament, deep medial collateral ligament, patellofemoral ligament  IMPLANTS UTILIZED: DePuy Attune size 7 posterior stabilized femoral component (cemented), size 9 rotating platform tibial component (cemented), 41 mm medialized dome patella (cemented), and a 6 mm stabilized rotating platform polyethylene insert.  INDICATIONS FOR SURGERY: Nathan Merritt is a 73 y.o. year old male with a long history of progressive knee pain. X-rays demonstrated severe degenerative changes in tricompartmental fashion. The patient had not seen any significant improvement despite conservative nonsurgical intervention. After discussion of the risks and benefits of surgical intervention, the patient expressed understanding of the risks benefits and agree with plans for total knee arthroplasty.   The risks, benefits, and alternatives were discussed at length including but not limited to the risks of infection, bleeding, nerve injury, stiffness, blood clots, the need for revision surgery, cardiopulmonary complications, among others, and they were willing to proceed.  PROCEDURE IN DETAIL: The patient was brought into the operating room and, after adequate spinal anesthesia was achieved, a tourniquet was placed on  the patient's upper thigh. The patient's knee and leg were cleaned and prepped with alcohol and DuraPrep and draped in the usual sterile fashion. A "timeout" was performed as per usual protocol. The lower extremity was exsanguinated using an Esmarch, and the tourniquet was inflated to 300 mmHg. An anterior longitudinal incision was made followed by a standard mid vastus approach.  There was a 3 cm soft tissue mass noted along the lateral aspect of the knee that appeared to be a lipoma.  This was excised and submitted to the lab for pathology.  The deep fibers of the medial collateral ligament were elevated in a subperiosteal fashion off of the medial flare of the tibia so as to maintain a continuous soft tissue sleeve. The patella was subluxed laterally and the patellofemoral ligament was incised. Inspection of the knee demonstrated severe degenerative changes with full-thickness loss of articular cartilage. Osteophytes were debrided using a rongeur. Anterior and posterior cruciate ligaments were excised. Two 4.0 mm Schanz pins were inserted in the femur and into the tibia for attachment of the array of trackers used for computer-assisted navigation. Hip center was identified using a circumduction technique. Distal landmarks were mapped using the computer. The distal femur and proximal tibia were mapped using the computer. The distal femoral cutting guide was positioned using computer-assisted navigation so as to achieve a 5 distal valgus cut. The femur was sized and it was felt that a size 7 femoral component was appropriate. A size 7 femoral cutting guide was positioned and the anterior cut was performed and verified using the computer. This was followed by completion of the posterior and chamfer cuts. Femoral cutting guide for the central box was then positioned in the center box cut was performed.  Attention was then directed to the proximal tibia. Medial and lateral menisci were excised. The  extramedullary  tibial cutting guide was positioned using computer-assisted navigation so as to achieve a 0 varus-valgus alignment and 3 posterior slope. The cut was performed and verified using the computer. The proximal tibia was sized and it was felt that a size 9 tibial tray was appropriate. Tibial and femoral trials were inserted followed by insertion of a 6 mm polyethylene insert. This allowed for excellent mediolateral soft tissue balancing both in flexion and in full extension. Finally, the patella was cut and prepared so as to accommodate a 41 mm medialized dome patella. A patella trial was placed and the knee was placed through a range of motion with excellent patellar tracking appreciated. The femoral trial was removed after debridement of posterior osteophytes. The central post-hole for the tibial component was reamed followed by insertion of a keel punch. Tibial trials were then removed. Cut surfaces of bone were irrigated with copious amounts of normal saline with antibiotic solution using pulsatile lavage and then suctioned dry. Polymethylmethacrylate cement was prepared in the usual fashion using a vacuum mixer. Cement was applied to the cut surface of the proximal tibia as well as along the undersurface of a size 9 rotating platform tibial component. Tibial component was positioned and impacted into place. Excess cement was removed using [employer] fast streamer. Cement was then applied to the cut surfaces of the femur as well as along the posterior flanges of the size 7 femoral component. The femoral component was positioned and impacted into place. Excess cement was removed using [employer] fast streamer. A 6 mm polyethylene trial was inserted and the knee was brought into full extension with steady axial compression applied. Finally, cement was applied to the backside of a 41 mm medialized dome patella and the patellar component was positioned and patellar clamp applied. Excess cement was removed using [employer] fast streamer. After  adequate curing of the cement, the tourniquet was deflated after a total tourniquet time of 125 minutes. Hemostasis was achieved using electrocautery. The knee was irrigated with copious amounts of normal saline with antibiotic solution using pulsatile lavage and then suctioned dry. 20 mL of 1.3% Exparel and 60 mL of 0.25% Marcaine in 40 mL of normal saline was injected along the posterior capsule, medial and lateral gutters, and along the arthrotomy site. A 6 mm stabilized rotating platform polyethylene insert was inserted and the knee was placed through a range of motion with excellent mediolateral soft tissue balancing appreciated and excellent patellar tracking noted. 2 medium drains were placed in the wound bed and brought out through separate stab incisions. The medial parapatellar portion of the incision was reapproximated using interrupted sutures of #1 Vicryl. Subcutaneous tissue was approximated in layers using first #0 Vicryl followed #2-0 Vicryl. The skin was approximated with skin staples. A sterile dressing was applied.  The patient tolerated the procedure well and was transported to the recovery room in stable condition.     P. Holley Bouche., M.D.

## 2019-02-04 ENCOUNTER — Encounter: Payer: Self-pay | Admitting: Orthopedic Surgery

## 2019-02-04 DIAGNOSIS — M1711 Unilateral primary osteoarthritis, right knee: Secondary | ICD-10-CM | POA: Diagnosis not present

## 2019-02-04 LAB — POCT I-STAT 4, (NA,K, GLUC, HGB,HCT)
Glucose, Bld: 111 mg/dL — ABNORMAL HIGH (ref 70–99)
HCT: 42 % (ref 39.0–52.0)
Hemoglobin: 14.3 g/dL (ref 13.0–17.0)
Potassium: 7.7 mmol/L (ref 3.5–5.1)
Sodium: 136 mmol/L (ref 135–145)

## 2019-02-04 LAB — SURGICAL PATHOLOGY

## 2019-02-04 MED ORDER — ENOXAPARIN SODIUM 40 MG/0.4ML ~~LOC~~ SOLN: 40.0000 mg | SUBCUTANEOUS | 0 refills | Status: DC

## 2019-02-04 MED ORDER — OXYCODONE HCL 5 MG PO TABS: 5.0000 mg | ORAL_TABLET | ORAL | 0 refills | Status: DC | PRN

## 2019-02-04 MED ORDER — TRAMADOL HCL 50 MG PO TABS: 50.0000 mg | ORAL_TABLET | Freq: Four times a day (QID) | ORAL | 0 refills | Status: DC | PRN

## 2019-02-04 NOTE — Evaluation (Signed)
Occupational Therapy Evaluation Patient Details Name: Nathan Merritt MRN: [id number] DOB: Jan 14, 1946 Today's Date: 02/04/2019    History of Present Illness Pt is a 73 y.o. male s/p R TKA secondary degenerative arthrosis 02/03/19.  PMH includes htn, neutropenia, and BPH.   Clinical Impression   Pt seen for OT evaluation this date, POD#1 from above surgery. Pt was independent in all ADLs prior to surgery, however using a SPC for mobility due to R knee pain. Pt is eager to return to PLOF with less pain and improved safety and independence. Pt currently requires minimal assist for LB dressing and bathing while in seated position due to pain with bending and limited AROM of R knee. Pt instructed in polar care mgt, falls prevention strategies, home/routines modifications, DME/AE for LB bathing and dressing tasks, and compression stocking mgt. Pt would benefit from skilled OT services including additional instruction in dressing techniques with or without assistive devices for dressing and bathing skills to support recall and carryover prior to discharge and ultimately to maximize safety, independence, and minimize falls risk and caregiver burden.       Follow Up Recommendations  Follow surgeon's recommendation for DC plan and follow-up therapies    Equipment Recommendations  3 in 1 bedside commode;Other (comment)(reacher)    Recommendations for Other Services       Precautions / Restrictions Precautions Precautions: Knee;Fall Precaution Booklet Issued: No Required Braces or Orthoses: Knee Immobilizer - Right Knee Immobilizer - Right: Discontinue once straight leg raise with < 10 degree lag Restrictions Weight Bearing Restrictions: Yes RLE Weight Bearing: Weight bearing as tolerated      Mobility Bed Mobility     General bed mobility comments: deferred, up in recliner at start/end of session  Transfers Overall transfer level: Needs assistance Equipment used: Rolling walker (2  wheeled) Transfers: Sit to/from Stand Sit to Stand: Min guard Stand pivot transfers: Min guard(bed to recliner)       General transfer comment: assist to initiate and come to full stand up to RW (from bed slightly elevated) but CGA from recliner; inital vc's for UE/LE placement    Balance Overall balance assessment: Needs assistance Sitting-balance support: No upper extremity supported;Feet supported Sitting balance-Leahy Scale: Good Sitting balance - Comments: steady sitting reaching within BOS   Standing balance support: No upper extremity supported Standing balance-Leahy Scale: Good Standing balance comment: steady standing using urinal                           ADL either performed or assessed with clinical judgement   ADL Overall ADL's : Needs assistance/impaired         Upper Body Bathing: Sitting;Supervision/ safety;Set up   Lower Body Bathing: Sit to/from stand;Minimal assistance   Upper Body Dressing : Sitting;Supervision/safety;Set up   Lower Body Dressing: Sit to/from stand;Minimal assistance   Toilet Transfer: Min guard;BSC;Ambulation;RW                   Vision Baseline Vision/History: Wears glasses Wears Glasses: At all times Patient Visual Report: No change from baseline Vision Assessment?: No apparent visual deficits     Perception     Praxis      Pertinent Vitals/Pain Pain Assessment: No/denies pain Pain Score: 2  Pain Location: R knee Pain Descriptors / Indicators: Sore Pain Intervention(s): Monitored during session     Hand Dominance Right   Extremity/Trunk Assessment Upper Extremity Assessment Upper Extremity Assessment: Overall WFL for tasks assessed  Lower Extremity Assessment Lower Extremity Assessment: Defer to PT evaluation;RLE deficits/detail RLE Deficits / Details: able to perform R LE SLR independently; good R quad set; DF/PF at least 3/5 AROM   Cervical / Trunk Assessment Cervical / Trunk Assessment:  Normal   Communication Communication Communication: No difficulties   Cognition Arousal/Alertness: Awake/alert Behavior During Therapy: WFL for tasks assessed/performed Overall Cognitive Status: Within Functional Limits for tasks assessed                                     General Comments  R knee bandages and hemovac in place    Exercises Other Exercises Other Exercises: pt instructed in AE/DME for ADL, falls prevention, polar care mgt, compression stocking mgt, and home/routines modifications to maximize safety/indep with ADL   Shoulder Instructions      Home Living Family/patient expects to be discharged to:: Private residence Living Arrangements: Alone Available Help at Discharge: Friend(s);Available PRN/intermittently(girlfriend can assist PRN) Type of Home: House Home Access: Stairs to enter [employer] of Steps: 1 Entrance Stairs-Rails: Right;Left;Can reach both Home Layout: One level     Bathroom Shower/Tub: Teacher, early years/pre: Standard     Home Equipment: Shower seat          Prior Functioning/Environment Level of Independence: Independent with assistive device(s)        Comments: Ambulatory with SPC; pt reports no falls in past 6 months.        OT Problem List: Decreased strength;Decreased range of motion      OT Treatment/Interventions: Self-care/ADL training;Therapeutic exercise;Therapeutic activities;DME and/or AE instruction;Patient/family education    OT Goals(Current goals can be found in the care plan section) Acute Rehab OT Goals Patient Stated Goal: to go to rehab OT Goal Formulation: With patient Time For Goal Achievement: 02/18/19 Potential to Achieve Goals: Good ADL Goals Pt Will Perform Lower Body Dressing: with min guard assist;sit to/from stand;with adaptive equipment Pt Will Transfer to Toilet: with supervision;ambulating(BSC over toilet, LRAD for amb) Additional ADL Goal #1: Pt will  independently instruct family/caregiver in compression stockings mgt including don/doffing, wear schedule, and positioning. Additional ADL Goal #2: Pt will independently instruct family/caregiver in polar care mgt including don/doffing, wear schedule, and positioning.  OT Frequency: Min 1X/week   Barriers to D/C:            Co-evaluation              AM-PAC OT "6 Clicks" Daily Activity     Outcome Measure Help from another person eating meals?: None Help from another person taking care of personal grooming?: None Help from another person toileting, which includes using toliet, bedpan, or urinal?: A Little Help from another person bathing (including washing, rinsing, drying)?: A Little Help from another person to put on and taking off regular upper body clothing?: None Help from another person to put on and taking off regular lower body clothing?: A Little 6 Click Score: 21   End of Session    Activity Tolerance: Patient tolerated treatment well Patient left: in chair;with call bell/phone within reach;with chair alarm set;with SCD's reapplied;Other (comment)(polar care in place)  OT Visit Diagnosis: Other abnormalities of gait and mobility (R26.89)                Time: 6010-9323 OT Time Calculation (min): 17 min Charges:  OT General Charges $OT Visit: 1 Visit OT Evaluation $OT Eval Low Complexity:  1 Low OT Treatments $Self Care/Home Management : 8-22 mins  Jeni Salles, MPH, MS, OTR/L ascom [phone] 02/04/19, 11:06 AM

## 2019-02-04 NOTE — Discharge Summary (Signed)
Physician Discharge Summary  Patient ID: Nathan Merritt MRN: [id number] DOB/AGE: [birth year] 73 y.o.  Admit date: 02/03/2019 Discharge date: 02/05/2019  Admission Diagnoses:  Total knee replacement status [Z96.659]   Discharge Diagnoses: Patient Active Problem List   Diagnosis Date Noted  . 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 Primary osteoarthritis of both knees 09/29/2018  . 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 Male erectile dysfunction, unspecified 02/05/2015  . Benign prostatic hyperplasia 06/10/2014  . Hyperlipidemia 06/10/2014  . Hypertension 06/10/2014  . 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 Lens replaced 07/31/2012  . Acute angle-closure glaucoma 05/27/2012    Past Medical History:  Diagnosis Date  . Arthritis   . BPH (benign prostatic hyperplasia)   . Glaucoma   . Glaucoma   . Hyperlipidemia   . Hypertension   . Neutropenia ([place])      Transfusion: Transfusion during this admission   Consultants (if any): None  Discharged Condition: Improved  Hospital Course: Nathan Merritt is an 73 y.o. male who was admitted 02/03/2019 with a diagnosis of degenerative arthrosis right knee and went to the operating room on 02/03/2019 and underwent the above named procedures.    Surgeries:Procedure(s): COMPUTER ASSISTED TOTAL KNEE ARTHROPLASTY RIGHT on 02/03/2019  PRE-OPERATIVE DIAGNOSIS: Degenerative arthrosis of the right knee, primary  POST-OPERATIVE DIAGNOSIS:  Same  PROCEDURE:  Right total knee arthroplasty using computer-assisted navigation  SURGEON:  Marciano Sequin. M.D.  ASSISTANT: Maximino Greenland, RN (present and scrubbed throughout the case, critical for assistance with exposure, retraction, instrumentation, and closure)  ANESTHESIA: spinal  ESTIMATED BLOOD LOSS: 50 mL  FLUIDS REPLACED: 1300 mL of crystalloid  TOURNIQUET TIME: 125 minutes  DRAINS: 2 medium Hemovac drains  SOFT TISSUE RELEASES: Anterior  cruciate ligament, posterior cruciate ligament, deep medial collateral ligament, patellofemoral ligament  IMPLANTS UTILIZED: DePuy Attune size 7 posterior stabilized femoral component (cemented), size 9 rotating platform tibial component (cemented), 41 mm medialized dome patella (cemented), and a 6 mm stabilized rotating platform polyethylene insert.  INDICATIONS FOR SURGERY: Nathan Merritt is a 73 y.o. year old male with a long history of progressive knee pain. X-rays demonstrated severe degenerative changes in tricompartmental fashion. The patient had not seen any significant improvement despite conservative nonsurgical intervention. After discussion of the risks and benefits of surgical intervention, the patient expressed understanding of the risks benefits and agree with plans for total knee arthroplasty.   The risks, benefits, and alternatives were discussed at length including but not limited to the risks of infection, bleeding, nerve injury, stiffness, blood clots, the need for revision surgery, cardiopulmonary complications, among others, and they were willing to proceed. Patient tolerated the surgery well. No complications .Patient was taken to PACU where she was stabilized and then transferred to the orthopedic floor.  Patient started on Lovenox 30 mg q 12 hrs. Foot pumps applied bilaterally at 80 mm hgb. Heels elevated off bed with rolled towels. No evidence of DVT. Calves non tender. Negative Homan. Physical therapy started on day #1 for gait training and transfer with OT starting on  day #1 for ADL and assisted devices. Patient has done well with therapy. Ambulated greater than 200 feet upon being discharged.  Was able to ascend and descend 4 steps safely and independently  Patient's IV And Foley were discontinued on day #1 with Hemovac being discontinued on day #2. Dressing was changed on day 2 prior to patient being discharged   He was given perioperative antibiotics:   Anti-infectives (From admission,  onward)   Start     Dose/Rate Route Frequency Ordered Stop   02/03/19 1400  ceFAZolin (ANCEF) IVPB 2g/100 mL premix     2 g 200 mL/hr over 30 Minutes Intravenous Every 6 hours 02/03/19 1332 02/04/19 1529   02/03/19 0621  ceFAZolin (ANCEF) 2-4 GM/100ML-% IVPB    Note to Pharmacy: Larita Fife   : cabinet override      02/03/19 0621 02/03/19 0733   02/03/19 0615  ceFAZolin (ANCEF) IVPB 2g/100 mL premix     2 g 200 mL/hr over 30 Minutes Intravenous On call to O.R. 02/03/19 7253 02/03/19 0733    .  He was fitted with AV 1 compression foot pump devices, instructed on heel pumps, early ambulation, and fitted with TED stockings bilaterally for DVT prophylaxis.  He benefited maximally from the hospital stay and there were no complications.    Recent vital signs:  Vitals:   02/04/19 0041 02/04/19 0515  BP: 126/81 132/77  Pulse: 69 68  Resp: 16 18  Temp: 98.6 F (37 C) 97.7 F (36.5 C)  SpO2: 98% 100%    Recent laboratory studies:  Lab Results  Component Value Date   HGB 14.6 02/03/2019   HGB 14.3 02/03/2019   HGB 15.3 01/30/2019   Lab Results  Component Value Date   WBC 4.2 01/30/2019   PLT 221 01/30/2019   Lab Results  Component Value Date   INR 1.0 02/03/2019   Lab Results  Component Value Date   NA 141 02/03/2019   K 3.8 02/03/2019   CL 105 01/30/2019   CO2 26 01/30/2019   BUN 18 01/30/2019   CREATININE 1.03 01/30/2019   GLUCOSE 113 (H) 02/03/2019    Discharge Medications:   Allergies as of 02/04/2019      Reactions   Acetazolamide Other (See Comments)   Eye drops, caused headache      Medication List    STOP taking these medications   naproxen sodium 220 MG tablet Commonly known as: ALEVE     TAKE these medications   amLODipine 10 MG tablet Commonly known as: NORVASC Take 10 mg by mouth daily.   dorzolamide-timolol 22.3-6.8 MG/ML ophthalmic solution Commonly known as: COSOPT Place 1 drop into both eyes 2  (two) times daily.   enoxaparin 40 MG/0.4ML injection Commonly known as: LOVENOX Inject 0.4 mLs (40 mg total) into the skin daily for 14 days. Start taking on: February 06, 2019   hydrochlorothiazide 25 MG tablet Commonly known as: HYDRODIURIL Take 25 mg by mouth daily.   oxyCODONE 5 MG immediate release tablet Commonly known as: Oxy IR/ROXICODONE Take 1 tablet (5 mg total) by mouth every 4 (four) hours as needed for moderate pain (pain score 4-6).   prednisoLONE acetate 1 % ophthalmic suspension Commonly known as: PRED FORTE Place 1 drop into the right eye daily.   sildenafil 20 MG tablet Commonly known as: REVATIO Take 60 mg by mouth daily as needed (for ED).   traMADol 50 MG tablet Commonly known as: ULTRAM Take 1-2 tablets (50-100 mg total) by mouth every 6 (six) hours as needed for moderate pain.            Durable Medical Equipment  (From admission, onward)         Start     Ordered   02/03/19 1333  DME Walker rolling  Once    Question:  Patient needs a walker to treat with the following condition  Answer:  Total knee replacement  status   02/03/19 1332   02/03/19 1333  DME Bedside commode  Once    Question:  Patient needs a bedside commode to treat with the following condition  Answer:  Total knee replacement status   02/03/19 1332          Diagnostic Studies: Dg Knee Right Port  Result Date: 02/03/2019 CLINICAL DATA:  Right knee pain EXAM: PORTABLE RIGHT KNEE - 1-2 VIEW COMPARISON:  None. FINDINGS: The right knee demonstrates a total knee arthroplasty without evidence of hardware failure complication. There is no significant joint effusion. There is no fracture or dislocation. The alignment is anatomic. Surgical drains are present. Post-surgical changes noted in the surrounding soft tissues. IMPRESSION: Interval right total knee arthroplasty. Electronically Signed   By: Kathreen Devoid   On: 02/03/2019 12:27    Disposition:   Discharge Instructions    Increase  activity slowly   Complete by: As directed       Follow-up Information    Watt Climes, PA On 02/17/2019.   Specialty: Physician Assistant Why: at 1:15pm Contact information: [place] [address] [phone]        Dereck Leep, MD On 03/18/2019.   Specialty: Orthopedic Surgery Why: at 1:30pm Contact information: [place] [address] [phone]            Signed: Watt Climes 02/04/2019, 7:43 AM

## 2019-02-04 NOTE — Care Management CC44 (Signed)
Condition Code 44 Documentation Completed  Patient Details  Name: Nathan Merritt MRN: [id number] Date of Birth: [birth year]   Condition Code 44 given:  Yes Patient signature on Condition Code 44 notice:  Yes Documentation of 2 MD's agreement:  Yes Code 44 added to claim:  Yes    Su Hilt, RN 02/04/2019, 11:27 AM

## 2019-02-04 NOTE — Progress Notes (Signed)
Physical Therapy Treatment Patient Details Name: Nathan Merritt MRN: [id number] DOB: May 07, 1946 Today's Date: 02/04/2019    History of Present Illness Pt is a 73 y.o. male s/p R TKA secondary degenerative arthrosis 02/03/19.  PMH includes htn, neutropenia, and BPH.    PT Comments    Pt able to progress to ambulating 100 feet with RW CGA; intermittent vc's for walker use and gait technique.  Pain 0/10 R knee at rest beginning/end of session (minimal increase in R knee pain with ambulation).  Able to perform R LE SLR independently.  R knee flexion AROM to 105 degrees and R knee extension AROM 5 degrees short of neutral.  Will continue to focus on strengthening, R knee ROM, and progressive functional mobility during hospital stay.    Follow Up Recommendations  Follow surgeon's recommendation for DC plan and follow-up therapies     Equipment Recommendations  Rolling walker with 5" wheels;3in1 (PT)    Recommendations for Other Services OT consult     Precautions / Restrictions Precautions Precautions: Knee;Fall Precaution Booklet Issued: No Required Braces or Orthoses: Knee Immobilizer - Right Knee Immobilizer - Right: Discontinue once straight leg raise with < 10 degree lag Restrictions Weight Bearing Restrictions: Yes RLE Weight Bearing: Weight bearing as tolerated    Mobility  Bed Mobility Overal bed mobility: Needs Assistance Bed Mobility: Supine to Sit     Supine to sit: Supervision;HOB elevated     General bed mobility comments: assist for lines  Transfers Overall transfer level: Needs assistance Equipment used: Rolling walker (2 wheeled) Transfers: Sit to/from [facility] Sit to Stand: Min assist;Min guard Stand pivot transfers: Min guard(bed to recliner)       General transfer comment: assist to initiate and come to full stand up to RW (from bed slightly elevated) but CGA from recliner; inital vc's for UE/LE  placement  Ambulation/Gait Ambulation/Gait assistance: Min guard Gait Distance (Feet): 100 Feet Assistive device: Rolling walker (2 wheeled) Gait Pattern/deviations: Antalgic;Decreased stance time - right;Step-to pattern Gait velocity: decreased   General Gait Details: intermittent vc's for walker use and gait technique   Stairs             Wheelchair Mobility    Modified Rankin (Stroke Patients Only)       Balance Overall balance assessment: Needs assistance Sitting-balance support: No upper extremity supported;Feet supported Sitting balance-Leahy Scale: Good Sitting balance - Comments: steady sitting reaching within BOS   Standing balance support: No upper extremity supported Standing balance-Leahy Scale: Good Standing balance comment: steady standing using urinal                            Cognition Arousal/Alertness: Awake/alert Behavior During Therapy: WFL for tasks assessed/performed Overall Cognitive Status: Within Functional Limits for tasks assessed                                        Exercises Total Joint Exercises Ankle Circles/Pumps: AROM;Strengthening;Both;10 reps;Supine Quad Sets: AROM;Strengthening;Both;10 reps;Supine Short Arc Quad: AROM;Strengthening;Right;10 reps;Supine Heel Slides: AAROM;Strengthening;Right;10 reps;Supine Hip ABduction/ADduction: AAROM;Strengthening;Right;10 reps;Supine Straight Leg Raises: AROM;Strengthening;Right;10 reps;Supine(initial vc's for technique) Goniometric ROM: R knee extension AROM 5 degrees short of neutral semi-supine in bed; R knee flexion AROM to 105 degrees sitting in recliner    General Comments General comments (skin integrity, edema, etc.): R knee bandages and hemovac in place.  Pt  agreeable to PT session.      Pertinent Vitals/Pain Pain Assessment: 0-10 Pain Score: 2  Pain Location: R knee Pain Descriptors / Indicators: Sore Pain Intervention(s): Limited activity  within patient's tolerance;Monitored during session;Premedicated before session;Repositioned;Other (comment)(polar care activated)  Vitals (HR and O2 on room air) stable and WFL throughout treatment session.    Home Living                      Prior Function            PT Goals (current goals can now be found in the care plan section) Acute Rehab PT Goals Patient Stated Goal: to be able to walk PT Goal Formulation: With patient Time For Goal Achievement: 02/17/19 Potential to Achieve Goals: Good Progress towards PT goals: Progressing toward goals    Frequency    BID      PT Plan Current plan remains appropriate    Co-evaluation              AM-PAC PT "6 Clicks" Mobility   Outcome Measure  Help needed turning from your back to your side while in a flat bed without using bedrails?: A Little Help needed moving from lying on your back to sitting on the side of a flat bed without using bedrails?: A Little Help needed moving to and from a bed to a chair (including a wheelchair)?: A Little Help needed standing up from a chair using your arms (e.g., wheelchair or bedside chair)?: A Little Help needed to walk in hospital room?: A Little Help needed climbing 3-5 steps with a railing? : A Little 6 Click Score: 18    End of Session Equipment Utilized During Treatment: Gait belt Activity Tolerance: Patient tolerated treatment well Patient left: in chair;with call bell/phone within reach;with chair alarm set;with SCD's reapplied(B heels elevated via towel rolls; polar care in place and activated) Nurse Communication: Mobility status;Patient requests pain meds;Precautions;Weight bearing status PT Visit Diagnosis: Other abnormalities of gait and mobility (R26.89);Muscle weakness (generalized) (M62.81);Difficulty in walking, not elsewhere classified (R26.2);Pain Pain - Right/Left: Right Pain - part of body: Knee     Time: 0916-1000 PT Time Calculation (min) (ACUTE  ONLY): 44 min  Charges:  $Gait Training: 8-22 mins $Therapeutic Exercise: 8-22 mins $Therapeutic Activity: 8-22 mins                    Leitha Bleak, PT 02/04/19, 10:26 AM [phone]

## 2019-02-04 NOTE — TOC Progression Note (Signed)
Transition of Care [facility]) - Progression Note    Patient Details  Name: Nathan Merritt MRN: [id number] Date of Birth: 1946-05-24  Transition of Care [facility]) CM/SW [place], RN Phone Number: 02/04/2019, 11:41 AM  Clinical Narrative:    Reviewed Bed offers with the patient in detail, he accepted the bed offer from [facility], I notified [name] and placed the offers in the chart.  I called [employer] and requested to start auth process.  I faxed the PT and OT notes to [place] at [phone], ref number [id number]  I explained to the patient that this will depend upon approval from insurance company.  He states understanding, The code 4 was reviewed and he signed it I provided a printed copy to the patient   Expected Discharge Plan: [place] Barriers to Discharge: Continued Medical Work up  Expected Discharge Plan and Services Expected Discharge Plan: [place] arrangements for the past 2 months: Single Family Home                                       Social Determinants of Health (SDOH) Interventions    Readmission Risk Interventions No flowsheet data found.

## 2019-02-04 NOTE — Progress Notes (Signed)
Physical Therapy Treatment Patient Details Name: Nathan Merritt MRN: [id number] DOB: [birth year] Today's Date: 02/04/2019    History of Present Illness Pt is a 73 y.o. male s/p R TKA secondary degenerative arthrosis 02/03/19.  PMH includes htn, neutropenia, and BPH.    PT Comments    Pt able to progress to ambulating 170 feet with RW CGA.  Overall steady ambulating with RW although occasional vc's for gait pattern and walker use required.  Pain 5-6/10 R knee at rest beginning of session but pain decreased with activity and was 0/10 at rest end of session.  Will continue to focus on strengthening, R knee ROM, and progressive functional mobility during hospitalization.    Follow Up Recommendations  Follow surgeon's recommendation for DC plan and follow-up therapies     Equipment Recommendations  Rolling walker with 5" wheels;3in1 (PT)    Recommendations for Other Services OT consult     Precautions / Restrictions Precautions Precautions: Knee;Fall Precaution Booklet Issued: Yes (comment) Required Braces or Orthoses: Knee Immobilizer - Right Knee Immobilizer - Right: Discontinue once straight leg raise with < 10 degree lag Restrictions Weight Bearing Restrictions: Yes RLE Weight Bearing: Weight bearing as tolerated    Mobility  Bed Mobility         General bed mobility comments: Deferred (pt up in recliner at start/end of session)  Transfers Overall transfer level: Needs assistance Equipment used: Rolling walker (2 wheeled) Transfers: Sit to/from Stand Sit to Stand: Supervision Stand pivot transfers: Min guard(bed to recliner)       General transfer comment: strong stand up to RW from recliner; no vc's required  Ambulation/Gait Ambulation/Gait assistance: Min guard Gait Distance (Feet): 170 Feet Assistive device: Rolling walker (2 wheeled) Gait Pattern/deviations: Antalgic;Decreased stance time - right Gait velocity: decreased   General Gait Details: step to  progressing to partial step through gait pattern with vc's for technique; occasional vc's for positioning within RW   Stairs             Wheelchair Mobility    Modified Rankin (Stroke Patients Only)       Balance Overall balance assessment: Needs assistance Sitting-balance support: No upper extremity supported;Feet supported Sitting balance-Leahy Scale: Normal Sitting balance - Comments: steady sitting reaching outside BOS   Standing balance support: Single extremity supported Standing balance-Leahy Scale: Good Standing balance comment: steady standing using urinal                            Cognition Arousal/Alertness: Awake/alert Behavior During Therapy: WFL for tasks assessed/performed Overall Cognitive Status: Within Functional Limits for tasks assessed                                        Exercises Total Joint Exercises Long Arc Quad: AROM;Strengthening;Right;10 reps;Seated Knee Flexion: AROM;Strengthening;Right;10 reps;Seated(paper towel under pt's foot to allow for sliding on floor) Goniometric ROM (from AM session): R knee extension AROM 5 degrees short of neutral semi-supine in bed; R knee flexion AROM to 105 degrees sitting in recliner General Exercises - Lower Extremity Hip Flexion/Marching: AROM;Strengthening;Both;10 reps;Seated    General Comments General comments (skin integrity, edema, etc.): R knee bandages and hemovac in place.  Nursing cleared pt for participation in physical therapy.  Pt agreeable to PT session.      Pertinent Vitals/Pain Pain Assessment: No/denies pain Pain Score: 0-No pain Pain  Location: R knee Pain Descriptors / Indicators: Sore Pain Intervention(s): Limited activity within patient's tolerance;Monitored during session;Repositioned;RN gave pain meds during session(polar care activated)  Vitals (HR and O2 on room air) stable and WFL throughout treatment session.    Home Living       Prior  Function        PT Goals (current goals can now be found in the care plan section) Acute Rehab PT Goals Patient Stated Goal: to improve walking PT Goal Formulation: With patient Time For Goal Achievement: 02/17/19 Potential to Achieve Goals: Good Progress towards PT goals: Progressing toward goals    Frequency    BID      PT Plan Current plan remains appropriate    Co-evaluation              AM-PAC PT "6 Clicks" Mobility   Outcome Measure  Help needed turning from your back to your side while in a flat bed without using bedrails?: A Little Help needed moving from lying on your back to sitting on the side of a flat bed without using bedrails?: A Little Help needed moving to and from a bed to a chair (including a wheelchair)?: A Little Help needed standing up from a chair using your arms (e.g., wheelchair or bedside chair)?: A Little Help needed to walk in hospital room?: A Little Help needed climbing 3-5 steps with a railing? : A Little 6 Click Score: 18    End of Session Equipment Utilized During Treatment: Gait belt Activity Tolerance: Patient tolerated treatment well Patient left: in chair;with call bell/phone within reach;with chair alarm set;with SCD's reapplied(B heels elevated via towel rolls; polar care in place and activated) Nurse Communication: Mobility status;Patient requests pain meds;Precautions;Weight bearing status PT Visit Diagnosis: Other abnormalities of gait and mobility (R26.89);Muscle weakness (generalized) (M62.81);Difficulty in walking, not elsewhere classified (R26.2);Pain Pain - Right/Left: Right Pain - part of body: Knee     Time: 1320-1350 PT Time Calculation (min) (ACUTE ONLY): 30 min  Charges:  $Gait Training: 8-22 mins $Therapeutic Exercise: 8-22 mins                    Leitha Bleak, PT 02/04/19, 2:13 PM [phone]

## 2019-02-04 NOTE — Progress Notes (Signed)
ORTHOPAEDICS PROGRESS NOTE  PATIENT NAME: Nathan Merritt DOB: 18-Nov-1945  MRN: [id number]  POD # 1: Right total knee arthroplasty  Subjective: The patient rested well last night.  Pain has been under good control. The patient denies any nausea or vomiting. Notes from physical therapy were reviewed.  The patient did well with therapy on the afternoon of surgery.  Objective: Vital signs in last 24 hours: Temp:  [96.9 F (36.1 C)-98.7 F (37.1 C)] 97.7 F (36.5 C) (06/16 0515) Pulse Rate:  [59-85] 68 (06/16 0515) Resp:  [16-22] 18 (06/16 0515) BP: (107-154)/(62-98) 132/77 (06/16 0515) SpO2:  [92 %-100 %] 100 % (06/16 0515)  Intake/Output from previous day: 06/15 0701 - 06/16 0700 In: 3324.2 [P.O.:240; I.V.:2584.2; IV Piggyback:500] Out: 2540 [Urine:1990; Drains:500; Blood:50]  Recent Labs    02/03/19 0625 02/03/19 0642 02/03/19 0649  HGB  --  14.3 14.6  HCT  --  42.0 43.0  K  --  7.7* 3.8  GLUCOSE  --  111* 113*  INR 1.0  --   --     EXAM General: Well-developed well-nourished male seen in no apparent discomfort. Lungs: clear to auscultation Cardiac: normal rate, regular rhythm, normal S1, S2, no murmurs, rubs, clicks or gallops, normal rate and regular rhythm Abdomen: Soft, nontender, nondistended.  Active bowel sounds are present. Right lower extremity: Knee dressing is dry and intact.  Hemovac drain and Polar Care are in place and functioning.  The patient is able to perform an independent straight leg raise.  Homans test is negative. Neurologic: Awake, alert, and oriented.  Sensory and motor function are grossly intact.  Assessment: Right total knee arthroplasty  Secondary diagnoses: Hypertension Hyperlipidemia Glaucoma Benign prostatic hypertrophy  Plan: Today's goals were reviewed with the patient. Continue with physical therapy and Occupational Therapy as per total knee arthroplasty rehab protocol. Plan is to go Skilled nursing facility after hospital  stay, due to the patient living alone. DVT Prophylaxis - Lovenox, Foot Pumps and TED hose  Reason Helzer P. Holley Bouche M.D.

## 2019-02-04 NOTE — Anesthesia Postprocedure Evaluation (Signed)
Anesthesia Post Note  Patient: Nathan Merritt  Procedure(s) Performed: COMPUTER ASSISTED TOTAL KNEE ARTHROPLASTY RIGHT (Right Knee)  Patient location during evaluation: Nursing Unit Anesthesia Type: Spinal Level of consciousness: awake, awake and alert, oriented and patient cooperative Pain management: pain level controlled Vital Signs Assessment: post-procedure vital signs reviewed and stable Respiratory status: spontaneous breathing, nonlabored ventilation and respiratory function stable Cardiovascular status: stable Postop Assessment: no headache, no backache, patient able to bend at knees, no apparent nausea or vomiting and adequate PO intake Anesthetic complications: no     Last Vitals:  Vitals:   02/04/19 0041 02/04/19 0515  BP: 126/81 132/77  Pulse: 69 68  Resp: 16 18  Temp: 37 C 36.5 C  SpO2: 98% 100%    Last Pain:  Vitals:   02/04/19 0515  TempSrc: Oral  PainSc:                  Ricki Miller

## 2019-02-05 DIAGNOSIS — M1711 Unilateral primary osteoarthritis, right knee: Secondary | ICD-10-CM | POA: Diagnosis not present

## 2019-02-05 LAB — URINE CULTURE: Culture: NO GROWTH

## 2019-02-05 NOTE — Progress Notes (Signed)
Physical Therapy Treatment Patient Details Name: Nathan Merritt MRN: [id number] DOB: 01-05-1946 Today's Date: 02/05/2019    History of Present Illness Pt is a 73 y.o. male s/p R TKA secondary degenerative arthrosis 02/03/19.  PMH includes htn, neutropenia, and BPH.    PT Comments    Pt able to progress to ambulating 180 feet with RW CGA (step to gait pattern) and navigated 4 steps with CGA x2 for safety (use of UE support).  R knee flexion AAROM to 108 degrees.  Tolerated session well.  Needs to continue to focus on LE strengthening, R knee ROM, and improving gait technique (progressing to step through gait pattern with least restrictive assistive device).    Follow Up Recommendations  Follow surgeon's recommendation for DC plan and follow-up therapies     Equipment Recommendations  Rolling walker with 5" wheels;3in1 (PT)    Recommendations for Other Services       Precautions / Restrictions Precautions Precautions: Knee;Fall Precaution Booklet Issued: Yes (comment) Required Braces or Orthoses: Knee Immobilizer - Right Knee Immobilizer - Right: Discontinue once straight leg raise with < 10 degree lag Restrictions Weight Bearing Restrictions: Yes RLE Weight Bearing: Weight bearing as tolerated    Mobility  Bed Mobility Overal bed mobility: Modified Independent Bed Mobility: Supine to Sit;Sit to Supine     Supine to sit: Modified independent (Device/Increase time) Sit to supine: Modified independent (Device/Increase time)   General bed mobility comments: mild increased effort to perform on own  Transfers Overall transfer level: Needs assistance Equipment used: Rolling walker (2 wheeled) Transfers: Sit to/from Stand Sit to Stand: Supervision Stand pivot transfers: Supervision       General transfer comment: increased effort and time to stand from bed; strong stand noted from recliner; no vc's required  Ambulation/Gait Ambulation/Gait assistance: Min guard Gait  Distance (Feet): 180 Feet Assistive device: Rolling walker (2 wheeled) Gait Pattern/deviations: Antalgic;Decreased stance time - right Gait velocity: decreased   General Gait Details: step to gait pattern; occasional vc's for gait technique; steady with RW   Merritt Merritt: Yes Merritt assistance: Min guard;+2 safety/equipment Stair Management: Step to pattern;Forwards Number of Merritt: 4 General stair comments: ascended/descended 3 steps with B railings and 1 step with RW; initial visual demo and intermittent vc's for technique   Wheelchair Mobility    Modified Rankin (Stroke Patients Only)       Balance Overall balance assessment: Needs assistance Sitting-balance support: No upper extremity supported;Feet supported Sitting balance-Leahy Scale: Normal Sitting balance - Comments: steady sitting reaching outside BOS   Standing balance support: No upper extremity supported Standing balance-Leahy Scale: Good Standing balance comment: steady standing reaching within BOS                            Cognition Arousal/Alertness: Awake/alert Behavior During Therapy: WFL for tasks assessed/performed Overall Cognitive Status: Within Functional Limits for tasks assessed                                        Exercises Total Joint Exercises Ankle Circles/Pumps: AROM;Strengthening;Both;10 reps;Supine Quad Sets: AROM;Strengthening;Both;10 reps;Supine Short Arc Quad: AROM;Strengthening;Right;10 reps;Supine Heel Slides: AAROM;Strengthening;Right;10 reps;Supine Hip ABduction/ADduction: AAROM;Strengthening;Right;10 reps;Supine Straight Leg Raises: AROM;Strengthening;Right;10 reps;Supine Goniometric ROM: R knee extension AROM 3 degrees short of neutral semi-supine in bed; R knee flexion AAROM to 108 degrees sitting in recliner    General Comments  Nursing cleared pt for participation in physical therapy.  Pt agreeable to PT session.       Pertinent  Vitals/Pain  0/10 R knee pain beginning and end of session at rest.    Home Living                      Prior Function            PT Goals (current goals can now be found in the care plan section) Acute Rehab PT Goals Patient Stated Goal: to improve walking PT Goal Formulation: With patient Time For Goal Achievement: 02/17/19 Potential to Achieve Goals: Good Progress towards PT goals: Progressing toward goals    Frequency    BID      PT Plan Current plan remains appropriate    Co-evaluation              AM-PAC PT "6 Clicks" Mobility   Outcome Measure  Help needed turning from your back to your side while in a flat bed without using bedrails?: None Help needed moving from lying on your back to sitting on the side of a flat bed without using bedrails?: None Help needed moving to and from a bed to a chair (including a wheelchair)?: A Little Help needed standing up from a chair using your arms (e.g., wheelchair or bedside chair)?: A Little Help needed to walk in hospital room?: A Little Help needed climbing 3-5 steps with a railing? : A Little 6 Click Score: 20    End of Session Equipment Utilized During Treatment: Gait belt Activity Tolerance: Patient tolerated treatment well Patient left: in chair;with call bell/phone within reach;with chair alarm set;with SCD's reapplied(B heels elevated via towel rolls; polar care in place and activated) Nurse Communication: Mobility status;Patient requests pain meds;Precautions;Weight bearing status PT Visit Diagnosis: Other abnormalities of gait and mobility (R26.89);Muscle weakness (generalized) (M62.81);Difficulty in walking, not elsewhere classified (R26.2);Pain Pain - Right/Left: Right Pain - part of body: Knee     Time: 5027-7412 PT Time Calculation (min) (ACUTE ONLY): 38 min  Charges:  $Gait Training: 8-22 mins $Therapeutic Exercise: 8-22 mins $Therapeutic Activity: 8-22 mins                    Leitha Bleak, PT 02/05/19, 4:24 PM [phone]

## 2019-02-05 NOTE — Progress Notes (Signed)
Report called and given to Brittney at [facility]. IV removed. Dressing changed. 2 honeycomb dressings in discharge packet. Stable condition. Patient now ready for EMS.

## 2019-02-05 NOTE — TOC Progression Note (Signed)
Transition of Care [facility]) - Progression Note    Patient Details  Name: Nathan Merritt MRN: [id number] Date of Birth: 05/21/1946  [facility]) CM/SW Contact  Su Hilt, RN Phone Number: 02/05/2019, 8:23 AM  Clinical Narrative:     [place] called and gave Approval for 3 days, new clinical to be sent to them in 3 days.  Patient will DC to [facility]  Expected Discharge Plan: Skilled Nursing Facility Barriers to Discharge: Continued Medical Work up  Expected Discharge Plan and Services Expected Discharge Plan: [place] arrangements for the past 2 months: Single Family Home Expected Discharge Date: 02/05/19                                     Social Determinants of Health (SDOH) Interventions    Readmission Risk Interventions No flowsheet data found.

## 2019-02-05 NOTE — TOC Progression Note (Signed)
Transition of Care [facility]) - Progression Note    Patient Details  Name: Nathan Merritt MRN: [id number] Date of Birth: 11-28-1945  Transition of Care [facility]) CM/SW [place], RN Phone Number: 02/05/2019, 8:25 AM  Clinical Narrative:    Ronni Rumble at [facility] to get a room number and notify her that the patient will be coming today.  Left a secure VM requesting a call back with the information.   Expected Discharge Plan: [place] Barriers to Discharge: Continued Medical Work up  Expected Discharge Plan and Services Expected Discharge Plan: [place] arrangements for the past 2 months: Single Family Home Expected Discharge Date: 02/05/19                                     Social Determinants of Health (SDOH) Interventions    Readmission Risk Interventions No flowsheet data found.

## 2019-02-05 NOTE — Progress Notes (Signed)
   Subjective: 2 Days Post-Op Procedure(s) (LRB): COMPUTER ASSISTED TOTAL KNEE ARTHROPLASTY RIGHT (Right) Patient reports pain as 0 on 0-10 scale.   Patient is well, and has had no acute complaints or problems Patient did well with therapy yesterday.  Was able to ambulate around the nurses desk.  Range of motion 0-105.  Still needs to do steps today. Plan is to go Home after hospital stay. no nausea and no vomiting Patient denies any chest pains or shortness of breath.  Patient resting well.  Voicing no complaints   Objective: Vital signs in last 24 hours: Temp:  [97.7 F (36.5 C)] 97.7 F (36.5 C) (06/16 2347) Pulse Rate:  [65-72] 72 (06/16 2347) Resp:  [18] 18 (06/16 2347) BP: (110-124)/(66-75) 122/75 (06/16 2347) SpO2:  [94 %-100 %] 94 % (06/16 2347) well approximated incision Heels are non tender and elevated off the bed using rolled towels Intake/Output from previous day: 06/16 0701 - 06/17 0700 In: 1673.6 [P.O.:720; I.V.:753.6; IV Piggyback:200] Out: 870 [Urine:500; Drains:370] Intake/Output this shift: No intake/output data recorded.  Recent Labs    02/03/19 0642 02/03/19 0649  HGB 14.3 14.6   Recent Labs    02/03/19 0642 02/03/19 0649  HCT 42.0 43.0   Recent Labs    02/03/19 0642 02/03/19 0649  NA 136 141  K 7.7* 3.8  GLUCOSE 111* 113*   Recent Labs    02/03/19 0625  INR 1.0    EXAM General - Patient is Alert, Appropriate and Oriented Extremity - Neurologically intact Neurovascular intact Sensation intact distally Intact pulses distally Dorsiflexion/Plantar flexion intact No cellulitis present Compartment soft Dressing - scant drainage Motor Function - intact, moving foot and toes well on exam.  Patient able to do straight leg raise on his own  Past Medical History:  Diagnosis Date  . Arthritis   . BPH (benign prostatic hyperplasia)   . Glaucoma   . Glaucoma   . Hyperlipidemia   . Hypertension   . Neutropenia (HCC)      Assessment/Plan: 2 Days Post-Op Procedure(s) (LRB): COMPUTER ASSISTED TOTAL KNEE ARTHROPLASTY RIGHT (Right) Active Problems:   Total knee replacement status  Estimated body mass index is 29.75 kg/m as calculated from the following:   Height as of this encounter: 6\' 3"  (1.905 m).   Weight as of this encounter: 108 kg. Up with therapy Discharge home with home health  Labs: None DVT Prophylaxis - Lovenox, Foot Pumps and TED hose Weight-Bearing as tolerated to right leg Hemovac was discontinued today.  Into the drain appeared to be intact. Please wash operative leg, change dressing and apply TED stockings to both legs prior to being discharged Patient may be discharged home once he does steps this morning Please give the patient 2 extra honeycomb dressings to take home  [place]. Hayden Matheny 02/05/2019, 7:14 AM

## 2019-05-01 ENCOUNTER — Other Ambulatory Visit: Payer: Self-pay

## 2019-05-01 ENCOUNTER — Other Ambulatory Visit: Payer: Self-pay | Admitting: Internal Medicine

## 2019-05-01 ENCOUNTER — Ambulatory Visit
Admission: RE | Admit: 2019-05-01 | Discharge: 2019-05-01 | Disposition: A | Discharge status: Home / Self Care | Payer: Medicare PPO | Source: Ambulatory Visit | Attending: Internal Medicine | Admitting: Internal Medicine

## 2019-05-01 DIAGNOSIS — R600 Localized edema: Secondary | ICD-10-CM | POA: Insufficient documentation

## 2019-07-01 ENCOUNTER — Other Ambulatory Visit: Payer: Self-pay | Admitting: Internal Medicine

## 2019-07-01 DIAGNOSIS — R7989 Other specified abnormal findings of blood chemistry: Secondary | ICD-10-CM

## 2019-07-01 DIAGNOSIS — M109 Gout, unspecified: Secondary | ICD-10-CM | POA: Insufficient documentation

## 2019-07-08 ENCOUNTER — Ambulatory Visit
Admission: RE | Admit: 2019-07-08 | Discharge: 2019-07-08 | Disposition: A | Discharge status: Home / Self Care | Payer: Medicare PPO | Source: Ambulatory Visit | Attending: Internal Medicine | Admitting: Internal Medicine

## 2019-07-08 ENCOUNTER — Other Ambulatory Visit: Payer: Self-pay

## 2019-07-08 DIAGNOSIS — R7989 Other specified abnormal findings of blood chemistry: Secondary | ICD-10-CM | POA: Diagnosis present

## 2019-07-23 ENCOUNTER — Encounter: Payer: Self-pay | Admitting: Urology

## 2019-07-23 ENCOUNTER — Other Ambulatory Visit: Payer: Self-pay

## 2019-07-23 ENCOUNTER — Ambulatory Visit: Payer: Medicare PPO | Admitting: Urology

## 2019-07-23 VITALS — BP 177/79 | HR 81 | Ht 75.0 in | Wt 239.5 lb

## 2019-07-23 DIAGNOSIS — R972 Elevated prostate specific antigen [PSA]: Secondary | ICD-10-CM

## 2019-07-23 DIAGNOSIS — N5201 Erectile dysfunction due to arterial insufficiency: Secondary | ICD-10-CM | POA: Diagnosis not present

## 2019-07-23 MED ORDER — TADALAFIL 20 MG PO TABS: ORAL_TABLET | ORAL | 0 refills | Status: DC
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
## 2019-07-24 ENCOUNTER — Encounter: Payer: Self-pay | Admitting: Urology

## 2019-07-24 DIAGNOSIS — R972 Elevated prostate specific antigen [PSA]: Secondary | ICD-10-CM | POA: Insufficient documentation

## 2019-07-24 DIAGNOSIS — N5201 Erectile dysfunction due to arterial insufficiency: Secondary | ICD-10-CM | POA: Insufficient documentation

## 2019-07-28 ENCOUNTER — Ambulatory Visit: Payer: Self-pay | Admitting: Surgery

## 2019-07-30 ENCOUNTER — Other Ambulatory Visit: Payer: Self-pay

## 2019-07-30 ENCOUNTER — Encounter
Admission: RE | Admit: 2019-07-30 | Discharge: 2019-07-30 | Disposition: A | Discharge status: Home / Self Care | Payer: Medicare PPO | Source: Ambulatory Visit | Attending: Surgery | Admitting: Surgery

## 2019-07-30 DIAGNOSIS — I440 Atrioventricular block, first degree: Secondary | ICD-10-CM | POA: Diagnosis not present

## 2019-07-30 DIAGNOSIS — R9431 Abnormal electrocardiogram [ECG] [EKG]: Secondary | ICD-10-CM | POA: Insufficient documentation

## 2019-07-30 DIAGNOSIS — Z01810 Encounter for preprocedural cardiovascular examination: Secondary | ICD-10-CM | POA: Insufficient documentation

## 2019-07-30 DIAGNOSIS — E785 Hyperlipidemia, unspecified: Secondary | ICD-10-CM | POA: Insufficient documentation

## 2019-07-30 DIAGNOSIS — I10 Essential (primary) hypertension: Secondary | ICD-10-CM | POA: Diagnosis not present

## 2019-07-30 NOTE — Patient Instructions (Signed)
Your procedure is scheduled on: Friday 08/08/19 Report to [place]. To find out your arrival time please call [phone] between 1PM - 3PM on Thursday 08/07/19.  Remember: Instructions that are not followed completely may result in serious medical risk, up to and including death, or upon the discretion of your surgeon and anesthesiologist your surgery may need to be rescheduled.     _X__ 1. Do not eat food after midnight the night before your procedure.                 No gum chewing or hard candies. You may drink clear liquids up to 2 hours                 before you are scheduled to arrive for your surgery- DO not drink clear                 liquids within 2 hours of the start of your surgery.                 Clear Liquids include:  water, apple juice without pulp, clear carbohydrate                 drink such as Clearfast or Gatorade, Black Coffee or Tea (Do not add                 anything to coffee or tea). Diabetics water only  __X__2.  On the morning of surgery brush your teeth with toothpaste and water, you                 may rinse your mouth with mouthwash if you wish.  Do not swallow any              toothpaste of mouthwash.     _X__ 3.  No Alcohol for 24 hours before or after surgery.   _X__ 4.  Do Not Smoke or use e-cigarettes For 24 Hours Prior to Your Surgery.                 Do not use any chewable tobacco products for at least 6 hours prior to                 surgery.  ____  5.  Bring all medications with you on the day of surgery if instructed.   __X__  6.  Notify your doctor if there is any change in your medical condition      (cold, fever, infections).     Do not wear jewelry, make-up, hairpins, clips or nail polish. Do not wear lotions, powders, or perfumes.  Do not shave 48 hours prior to surgery. Men may shave face and neck. Do not bring valuables to the hospital.    [facility] is not responsible for  any belongings or valuables.  Contacts, dentures/partials or body piercings may not be worn into surgery. Bring a case for your contacts, glasses or hearing aids, a denture cup will be supplied. Leave your suitcase in the car. After surgery it may be brought to your room. For patients admitted to the hospital, discharge time is determined by your treatment team.   Patients discharged the day of surgery will not be allowed to drive home.   Please read over the following fact sheets that you were given:   MRSA Information  __X__ Take these medicines the morning of surgery with A SIP OF WATER:  1. allopurinol (ZYLOPRIM) 300 MG tablet  2. amLODipine (NORVASC) 10 MG tablet  3.   4.  5.  6.  ____ Fleet Enema (as directed)   __X__ Use CHG Soap/SAGE wipes as directed  ____ Use inhalers on the day of surgery  ____ Stop metformin/Janumet/Farxiga 2 days prior to surgery    ____ Take 1/2 of usual insulin dose the night before surgery. No insulin the morning          of surgery.   ____ Stop Blood Thinners Coumadin/Plavix/Xarelto/Pleta/Pradaxa/Eliquis/Effient/Aspirin  on   Or contact your Surgeon, Cardiologist or Medical Doctor regarding  ability to stop your blood thinners  __X__ Stop Anti-inflammatories 7 days before surgery such as Advil, Ibuprofen, Motrin,  BC or Goodies Powder, Naprosyn, Naproxen, Aleve, Aspirin    __X__ Stop all herbal supplements, fish oil or vitamin E until after surgery.    ____ Bring C-Pap to the hospital.

## 2019-08-05 ENCOUNTER — Other Ambulatory Visit
Admission: RE | Admit: 2019-08-05 | Discharge: 2019-08-05 | Disposition: A | Discharge status: Home / Self Care | Payer: Medicare PPO | Source: Ambulatory Visit | Attending: Surgery | Admitting: Surgery

## 2019-08-05 DIAGNOSIS — Z01812 Encounter for preprocedural laboratory examination: Secondary | ICD-10-CM | POA: Diagnosis present

## 2019-08-05 DIAGNOSIS — U071 COVID-19: Secondary | ICD-10-CM | POA: Insufficient documentation

## 2019-08-05 LAB — SARS CORONAVIRUS 2 (TAT 6-24 HRS): SARS Coronavirus 2: POSITIVE — AB

## 2019-08-06 ENCOUNTER — Telehealth: Payer: Self-pay | Admitting: Nurse Practitioner

## 2019-08-06 NOTE — Telephone Encounter (Signed)
Called to Discuss with patient about Covid symptoms and the use of bamlanivimab, a monoclonal antibody infusion for those with mild to moderate Covid symptoms and at a high risk of hospitalization.     Pt is qualified for this infusion at the [facility] infusion center due to co-morbid conditions and/or a member of an at-risk group.   Patient Active Problem List   Diagnosis Date Noted  . Erectile dysfunction due to arterial insufficiency 07/24/2019  . Elevated PSA 07/24/2019  . Arthritis due to gout 07/01/2019  . 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 Primary osteoarthritis of both knees 09/29/2018  . Primary osteoarthritis of left knee 09/29/2018  . 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 Male erectile dysfunction, unspecified 02/05/2015  . Benign prostatic hyperplasia 06/10/2014  . Hyperlipidemia 06/10/2014  . Hypertension 06/10/2014  . Neutropenic disorder ([place]) 06/10/2014  . Lens replaced 07/31/2012  . Acute angle-closure glaucoma 05/27/2012      Patient states that he had test done for upcoming procedure and has not had any symptoms. Symptoms tier reviewed as well as criteria for ending isolation. Preventative practices reviewed. Patient verbalized understanding.  Patient advised to call back if he becomes symptomatic and decides that he does want to get infusion. Callback number to the infusion center given. Patient advised to go to Urgent care or ED with severe symptoms.

## 2019-09-02 ENCOUNTER — Ambulatory Visit: Payer: Self-pay | Admitting: Surgery

## 2019-09-02 ENCOUNTER — Other Ambulatory Visit: Payer: Self-pay

## 2019-09-02 ENCOUNTER — Encounter
Admission: RE | Admit: 2019-09-02 | Discharge: 2019-09-02 | Disposition: A | Discharge status: Home / Self Care | Payer: Medicare PPO | Source: Ambulatory Visit | Attending: Surgery | Admitting: Surgery

## 2019-09-02 NOTE — Pre-Procedure Instructions (Signed)
Phone call to review health history.and verify home medication.  States nothing has changed since interview for cancelled surgery on 08/08/19 due to +covid.  He has CHG soap for shower.  Will not need another COVID test.

## 2019-09-02 NOTE — H&P (View-Only) (Signed)
Subjective:   CC: Calculus of gallbladder without cholecystitis without obstruction [K80.20]  HPI:  Nathan Merritt is a 74 y.o. male who was referred by Velna Ochs, MD for evaluation of above CC. Symptoms were first noted 2 month ago. Pain is intermittent, confined to the right flank, without radiation.  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  Past Surgical History:  has a past surgical history that includes Cataract extraction (Bilateral); Stent placement right eye; Knee surgery (Left); Colonoscopy (09/10/2015); and Right total knee arthroplasty using computer-assisted navigation (02/03/2019).  Family History: family history includes Diabetes type II in his brother; High blood pressure (Hypertension) in his brother and father; Stroke in his brother; Uterine cancer in his mother.  Social History:  reports that he has never smoked. He has never used smokeless tobacco. He reports that he does not drink alcohol or use drugs.  Current Medications: has a current medication list which includes the following prescription(s): allopurinol, amlodipine, dorzolamide-timolol, hydrochlorothiazide, prednisolone acetate, and sildenafil (pulm.hypertension).  Allergies:       Allergies as of 07/22/2019 - Reviewed 07/22/2019  Allergen Reaction Noted  . Acetazolamide Headache 12/18/2012    ROS:  A 15 point review of systems was performed and pertinent positives and negatives noted in HPI    Objective:   BP (!) 156/90   Pulse 68   Ht 190.5 cm (6\' 3" )   Wt (!) 107.5 kg (237 lb)   BMI 29.62 kg/m     Constitutional :  alert, appears stated age, cooperative and no distress  Lymphatics/Throat:  no asymmetry, masses, or scars  Respiratory:  clear to auscultation bilaterally  Cardiovascular:  regular rate and rhythm  Gastrointestinal: soft, non-tender; bowel sounds normal; no masses,  no organomegaly.  No TTP along flank area where pain is  Musculoskeletal: Steady gait and movement  Skin: Cool and moist,   Psychiatric: Normal affect, non-agitated, not confused       LABS:  -      Lab Results  Component Value Date   WBC 5.1 06/24/2019   HGB 15.5 06/24/2019   HCT 46.2 06/24/2019   PLT 257 06/24/2019   -      Lab Results  Component Value Date   NA 137 06/24/2019   K 4.2 06/24/2019   CL 100 06/24/2019   CO2 33.3 (H) 06/24/2019   BUN 15 06/24/2019   CREATININE 1.1 06/24/2019   CALCIUM 9.7 06/24/2019   ALB 4.2 06/24/2019   TBILI 1.4 (H) 06/24/2019   ALKPHOS 170 (H) 06/24/2019   AST 12 06/24/2019   ALT 9 06/24/2019   GLUCOSE 97 06/24/2019   GFR 80 06/24/2019     RADS: CLINICAL DATA: 74 year old male with elevated LFTs.  EXAM: ULTRASOUND ABDOMEN LIMITED RIGHT UPPER QUADRANT  COMPARISON: None.  FINDINGS: Gallbladder:  Large amount of sludge as well as stones noted within the gallbladder. There is no gallbladder wall thickening or pericholecystic fluid. Negative sonographic Murphy's sign.  Common bile duct:  Diameter: 4 mm  Liver:  There is diffuse increased liver echogenicity most commonly seen in the setting of fatty infiltration. Superimposed inflammation or fibrosis is not excluded. Clinical correlation is recommended. Portal vein is patent on color  Doppler imaging with normal direction of blood flow towards the liver.  Other: None.  IMPRESSION: 1. Gallbladder sludge and stones. No sonographic findings of acute cholecystitis. 2. Fatty liver.   Electronically Signed  By: Anner Crete M.D.  On: 07/08/2019 16:07   Other Result Information  Interface, Rad Results In - 07/08/2019  4:10 PM EST CLINICAL DATA:  74 year old male with elevated LFTs.  EXAM: ULTRASOUND ABDOMEN LIMITED RIGHT UPPER QUADRANT  COMPARISON:  None.  FINDINGS: Gallbladder:  Large amount of sludge as well as stones noted within the gallbladder. There is no gallbladder wall thickening or pericholecystic fluid. Negative sonographic Murphy's sign.  Common bile duct:  Diameter: 4 mm  Liver:  There is diffuse increased liver echogenicity most commonly seen in the setting of fatty infiltration. Superimposed inflammation or fibrosis is not excluded. Clinical correlation is recommended. Portal vein is patent on color Doppler imaging with normal direction of blood flow towards the liver.  Other: None.  IMPRESSION: 1. Gallbladder sludge and stones. No sonographic findings of acute cholecystitis. 2. Fatty liver.   Electronically Signed   By: Anner Crete M.D.   On: 07/08/2019 16:07    Assessment:      Calculus of gallbladder without cholecystitis without obstruction [K80.20]  Plan:   1. Calculus of gallbladder without cholecystitis without obstruction [K80.20] Discussed the risk of surgery including post-op infxn, seroma, biloma, chronic pain, poor-delayed wound healing, retained gallstone, conversion to open procedure, post-op SBO or ileus, and need for additional procedures to address said risks.  The risks of general anesthetic including MI, CVA, sudden death or even reaction to anesthetic medications also discussed. Alternatives include continued observation.  Benefits include possible symptom relief, prevention of complications including acute cholecystitis, pancreatitis.  Typical post operative recovery of 3-5 days rest, continued pain in area and incision sites, possible loose stools up to 4-6 weeks, also discussed.  ED return precautions given for sudden increase in RUQ pain, with possible  accompanying fever, nausea, and/or vomiting.  The patient understands the risks, any and all questions were answered to the patient's satisfaction.  2.Patient wants to proceed with gallbladder removal to see symptoms resolved.  Explained to him that his presentation is very atypical and there is no guarantee that the pain will resolve afterwards.  Patient at this point does not want any further work-up or a trial of higher dose anti-inflammatories to see if the pain goes away, since my main differential at this point still is musculoskeletal in origin despite chronicity.  He verbalized understanding and stated that he still wishes to proceed with the surgery.

## 2019-09-02 NOTE — Patient Instructions (Signed)
Your procedure is scheduled on: Friday 1/15 Report to Day Surgery. To find out your arrival time please call [phone] between 1PM - 3PM on Thurs. 1/14.  Remember: Instructions that are not followed completely may result in serious medical risk,  up to and including death, or upon the discretion of your surgeon and anesthesiologist your  surgery may need to be rescheduled.     _X__ 1. Do not eat food after midnight the night before your procedure.                 No gum chewing or hard candies. You may drink clear liquids up to 2 hours                 before you are scheduled to arrive for your surgery- DO not drink clear                 liquids within 2 hours of the start of your surgery.                 Clear Liquids include:  water, apple juice without pulp, clear carbohydrate                 drink such as Clearfast of Gatorade, Black Coffee or Tea (Do not add                 anything to coffee or tea).  __X__2.  On the morning of surgery brush your teeth with toothpaste and water, you                may rinse your mouth with mouthwash if you wish.  Do not swallow any toothpaste of mouthwash.     _X__ 3.  No Alcohol for 24 hours before or after surgery.   ___ 4.  Do Not Smoke or use e-cigarettes For 24 Hours Prior to Your Surgery.                 Do not use any chewable tobacco products for at least 6 hours prior to                 surgery.  ____  5.  Bring all medications with you on the day of surgery if instructed.   __x__  6.  Notify your doctor if there is any change in your medical condition      (cold, fever, infections).     Do not wear jewelry, make-up, hairpins, clips or nail polish. Do not wear lotions, powders, or perfumes. You may wear deodorant. Do not shave 48 hours prior to surgery. Men may shave face and neck. Do not bring valuables to the hospital.    [facility] is not responsible for any belongings or valuables.  Contacts,  dentures or bridgework may not be worn into surgery. Leave your suitcase in the car. After surgery it may be brought to your room. For patients admitted to the hospital, discharge time is determined by your treatment team.   Patients discharged the day of surgery will not be allowed to drive home.   Please read over the following fact sheets that you were given:   __x__ Take these medicines the morning of surgery with A SIP OF WATER:    1. allopurinol (ZYLOPRIM) 300 MG tablet  2. amLODipine (NORVASC) 10 MG tablet  3. dorzolamide-timolol (COSOPT) 22.3-6.8 MG/ML ophthalmic solution  4.prednisoLONE acetate (PRED FORTE) 1 % ophthalmic suspension  5.  6.  ____ [employer]  Enema (as directed)   __x__ Use CHG Soap as directed  ____ Use inhalers on the day of surgery  ____ Stop metformin 2 days prior to surgery    ____ Take 1/2 of usual insulin dose the night before surgery. No insulin the morning          of surgery.   ____ Stop Coumadin/Plavix/aspirin on   _x___ Stop Anti-inflammatories No ibuprofen aleve or aspirin products until after surgery    Tylenol is OK   ____ Stop supplements until after surgery.    ____ Bring C-Pap to the hospital.

## 2019-09-02 NOTE — H&P (Signed)
Subjective:   CC: Calculus of gallbladder without cholecystitis without obstruction [K80.20]  HPI:  Nathan Merritt is a 74 y.o. male who was referred by Velna Ochs, MD for evaluation of above CC. Symptoms were first noted 2 month ago. Pain is intermittent, confined to the right flank, without radiation.  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  Past Surgical History:  has a past surgical history that includes Cataract extraction (Bilateral); Stent placement right eye; Knee surgery (Left); Colonoscopy (09/10/2015); and Right total knee arthroplasty using computer-assisted navigation (02/03/2019).  Family History: family history includes Diabetes type II in his brother; High blood pressure (Hypertension) in his brother and father; Stroke in his brother; Uterine cancer in his mother.  Social History:  reports that he has never smoked. He has never used smokeless tobacco. He reports that he does not drink alcohol or use drugs.  Current Medications: has a current medication list which includes the following prescription(s): allopurinol, amlodipine, dorzolamide-timolol, hydrochlorothiazide, prednisolone acetate, and sildenafil (pulm.hypertension).  Allergies:       Allergies as of 07/22/2019 - Reviewed 07/22/2019  Allergen Reaction Noted  . Acetazolamide Headache 12/18/2012    ROS:  A 15 point review of systems was performed and pertinent positives and negatives noted in HPI    Objective:   BP (!) 156/90   Pulse 68   Ht 190.5 cm (6\' 3" )   Wt (!) 107.5 kg (237 lb)   BMI 29.62 kg/m     Constitutional :  alert, appears stated age, cooperative and no distress  Lymphatics/Throat:  no asymmetry, masses, or scars  Respiratory:  clear to auscultation bilaterally  Cardiovascular:  regular rate and rhythm  Gastrointestinal: soft, non-tender; bowel sounds normal; no masses,  no organomegaly.  No TTP along flank area where pain is  Musculoskeletal: Steady gait and movement  Skin: Cool and moist,   Psychiatric: Normal affect, non-agitated, not confused       LABS:  -      Lab Results  Component Value Date   WBC 5.1 06/24/2019   HGB 15.5 06/24/2019   HCT 46.2 06/24/2019   PLT 257 06/24/2019   -      Lab Results  Component Value Date   NA 137 06/24/2019   K 4.2 06/24/2019   CL 100 06/24/2019   CO2 33.3 (H) 06/24/2019   BUN 15 06/24/2019   CREATININE 1.1 06/24/2019   CALCIUM 9.7 06/24/2019   ALB 4.2 06/24/2019   TBILI 1.4 (H) 06/24/2019   ALKPHOS 170 (H) 06/24/2019   AST 12 06/24/2019   ALT 9 06/24/2019   GLUCOSE 97 06/24/2019   GFR 80 06/24/2019     RADS: CLINICAL DATA: 74 year old male with elevated LFTs.  EXAM: ULTRASOUND ABDOMEN LIMITED RIGHT UPPER QUADRANT  COMPARISON: None.  FINDINGS: Gallbladder:  Large amount of sludge as well as stones noted within the gallbladder. There is no gallbladder wall thickening or pericholecystic fluid. Negative sonographic Murphy's sign.  Common bile duct:  Diameter: 4 mm  Liver:  There is diffuse increased liver echogenicity most commonly seen in the setting of fatty infiltration. Superimposed inflammation or fibrosis is not excluded. Clinical correlation is recommended. Portal vein is patent on color  Doppler imaging with normal direction of blood flow towards the liver.  Other: None.  IMPRESSION: 1. Gallbladder sludge and stones. No sonographic findings of acute cholecystitis. 2. Fatty liver.   Electronically Signed  By: Anner Crete M.D.  On: 07/08/2019 16:07   Other Result Information  Interface, Rad Results In - 07/08/2019  4:10 PM EST CLINICAL DATA:  74 year old male with elevated LFTs.  EXAM: ULTRASOUND ABDOMEN LIMITED RIGHT UPPER QUADRANT  COMPARISON:  None.  FINDINGS: Gallbladder:  Large amount of sludge as well as stones noted within the gallbladder. There is no gallbladder wall thickening or pericholecystic fluid. Negative sonographic Murphy's sign.  Common bile duct:  Diameter: 4 mm  Liver:  There is diffuse increased liver echogenicity most commonly seen in the setting of fatty infiltration. Superimposed inflammation or fibrosis is not excluded. Clinical correlation is recommended. Portal vein is patent on color Doppler imaging with normal direction of blood flow towards the liver.  Other: None.  IMPRESSION: 1. Gallbladder sludge and stones. No sonographic findings of acute cholecystitis. 2. Fatty liver.   Electronically Signed   By: Anner Crete M.D.   On: 07/08/2019 16:07    Assessment:      Calculus of gallbladder without cholecystitis without obstruction [K80.20]  Plan:   1. Calculus of gallbladder without cholecystitis without obstruction [K80.20] Discussed the risk of surgery including post-op infxn, seroma, biloma, chronic pain, poor-delayed wound healing, retained gallstone, conversion to open procedure, post-op SBO or ileus, and need for additional procedures to address said risks.  The risks of general anesthetic including MI, CVA, sudden death or even reaction to anesthetic medications also discussed. Alternatives include continued observation.  Benefits include possible symptom relief, prevention of complications including acute cholecystitis, pancreatitis.  Typical post operative recovery of 3-5 days rest, continued pain in area and incision sites, possible loose stools up to 4-6 weeks, also discussed.  ED return precautions given for sudden increase in RUQ pain, with possible  accompanying fever, nausea, and/or vomiting.  The patient understands the risks, any and all questions were answered to the patient's satisfaction.  2.Patient wants to proceed with gallbladder removal to see symptoms resolved.  Explained to him that his presentation is very atypical and there is no guarantee that the pain will resolve afterwards.  Patient at this point does not want any further work-up or a trial of higher dose anti-inflammatories to see if the pain goes away, since my main differential at this point still is musculoskeletal in origin despite chronicity.  He verbalized understanding and stated that he still wishes to proceed with the surgery.

## 2019-09-03 ENCOUNTER — Other Ambulatory Visit: Payer: Medicare PPO

## 2019-09-04 MED ORDER — CEFAZOLIN SODIUM-DEXTROSE 2-4 GM/100ML-% IV SOLN
2.0000 g | INTRAVENOUS | Status: AC
  Administered 2019-09-05: 08:00:00 2 g via INTRAVENOUS

## 2019-09-05 ENCOUNTER — Encounter: Payer: Self-pay | Admitting: Surgery

## 2019-09-05 ENCOUNTER — Ambulatory Visit: Payer: Medicare PPO | Admitting: Anesthesiology

## 2019-09-05 ENCOUNTER — Encounter
Admission: RE | Disposition: A | Discharge status: Home / Self Care | Payer: Self-pay | Source: Home / Self Care | Attending: Surgery

## 2019-09-05 ENCOUNTER — Ambulatory Visit
Admission: RE | Admit: 2019-09-05 | Discharge: 2019-09-05 | Disposition: A | Discharge status: Home / Self Care | Payer: Medicare PPO | Attending: Surgery | Admitting: Surgery

## 2019-09-05 DIAGNOSIS — N5201 Erectile dysfunction due to arterial insufficiency: Secondary | ICD-10-CM | POA: Diagnosis not present

## 2019-09-05 DIAGNOSIS — Z79899 Other long term (current) drug therapy: Secondary | ICD-10-CM | POA: Diagnosis not present

## 2019-09-05 DIAGNOSIS — M109 Gout, unspecified: Secondary | ICD-10-CM | POA: Insufficient documentation

## 2019-09-05 DIAGNOSIS — Z96651 Presence of right artificial knee joint: Secondary | ICD-10-CM | POA: Diagnosis not present

## 2019-09-05 DIAGNOSIS — K8020 Calculus of gallbladder without cholecystitis without obstruction: Secondary | ICD-10-CM | POA: Diagnosis present

## 2019-09-05 DIAGNOSIS — K810 Acute cholecystitis: Secondary | ICD-10-CM

## 2019-09-05 DIAGNOSIS — K8010 Calculus of gallbladder with chronic cholecystitis without obstruction: Secondary | ICD-10-CM | POA: Diagnosis not present

## 2019-09-05 DIAGNOSIS — H4020X0 Unspecified primary angle-closure glaucoma, stage unspecified: Secondary | ICD-10-CM | POA: Diagnosis not present

## 2019-09-05 DIAGNOSIS — I272 Pulmonary hypertension, unspecified: Secondary | ICD-10-CM | POA: Diagnosis not present

## 2019-09-05 DIAGNOSIS — I10 Essential (primary) hypertension: Secondary | ICD-10-CM | POA: Diagnosis not present

## 2019-09-05 HISTORY — PX: CHOLECYSTECTOMY: SHX55

## 2019-09-05 SURGERY — CHOLECYSTECTOMY, ROBOT-ASSISTED, LAPAROSCOPIC
Anesthesia: General

## 2019-09-05 MED ORDER — PHENYLEPHRINE HCL (PRESSORS) 10 MG/ML IV SOLN
INTRAVENOUS | Status: AC
  Filled 2019-09-05: qty 1

## 2019-09-05 MED ORDER — SUCCINYLCHOLINE CHLORIDE 20 MG/ML IJ SOLN
INTRAMUSCULAR | Status: DC | PRN
  Administered 2019-09-05: 200 mg via INTRAVENOUS

## 2019-09-05 MED ORDER — FENTANYL CITRATE (PF) 100 MCG/2ML IJ SOLN
INTRAMUSCULAR | Status: AC
  Filled 2019-09-05: qty 2

## 2019-09-05 MED ORDER — BUPIVACAINE HCL (PF) 0.5 % IJ SOLN
INTRAMUSCULAR | Status: AC
  Filled 2019-09-05: qty 30

## 2019-09-05 MED ORDER — SEVOFLURANE IN SOLN
RESPIRATORY_TRACT | Status: AC
  Filled 2019-09-05: qty 250

## 2019-09-05 MED ORDER — PROPOFOL 10 MG/ML IV BOLUS
INTRAVENOUS | Status: DC | PRN
  Administered 2019-09-05: 50 mg via INTRAVENOUS
  Administered 2019-09-05 (×2): 30 mg via INTRAVENOUS
  Administered 2019-09-05: 40 mg via INTRAVENOUS
  Administered 2019-09-05: 150 mg via INTRAVENOUS

## 2019-09-05 MED ORDER — MIDAZOLAM HCL 2 MG/2ML IJ SOLN
INTRAMUSCULAR | Status: DC | PRN
  Administered 2019-09-05: 2 mg via INTRAVENOUS

## 2019-09-05 MED ORDER — OXYCODONE HCL 5 MG PO TABS
ORAL_TABLET | ORAL | Status: AC
  Filled 2019-09-05: qty 1

## 2019-09-05 MED ORDER — LIDOCAINE-EPINEPHRINE (PF) 1 %-1:200000 IJ SOLN
INTRAMUSCULAR | Status: DC | PRN
  Administered 2019-09-05: 16 mL via SUBCUTANEOUS

## 2019-09-05 MED ORDER — IPRATROPIUM-ALBUTEROL 0.5-2.5 (3) MG/3ML IN SOLN
3.0000 mL | Freq: Once | RESPIRATORY_TRACT | Status: AC
  Administered 2019-09-05: 3 mL via RESPIRATORY_TRACT

## 2019-09-05 MED ORDER — SUGAMMADEX SODIUM 500 MG/5ML IV SOLN
INTRAVENOUS | Status: AC
  Filled 2019-09-05: qty 5

## 2019-09-05 MED ORDER — OXYCODONE HCL 5 MG/5ML PO SOLN: 5.0000 mg | Freq: Once | ORAL | Status: AC | PRN

## 2019-09-05 MED ORDER — EPINEPHRINE PF 1 MG/ML IJ SOLN
INTRAMUSCULAR | Status: AC
  Filled 2019-09-05: qty 1

## 2019-09-05 MED ORDER — CHLORHEXIDINE GLUCONATE CLOTH 2 % EX PADS
6.0000 | MEDICATED_PAD | Freq: Once | CUTANEOUS | Status: AC
  Administered 2019-09-05: 6 via TOPICAL

## 2019-09-05 MED ORDER — DOCUSATE SODIUM 100 MG PO CAPS: 100.0000 mg | ORAL_CAPSULE | Freq: Two times a day (BID) | ORAL | 0 refills | Status: AC | PRN

## 2019-09-05 MED ORDER — ACETAMINOPHEN 10 MG/ML IV SOLN
INTRAVENOUS | Status: AC
  Filled 2019-09-05: qty 100

## 2019-09-05 MED ORDER — OXYCODONE HCL 5 MG PO TABS
5.0000 mg | ORAL_TABLET | Freq: Once | ORAL | Status: AC | PRN
  Administered 2019-09-05: 5 mg via ORAL

## 2019-09-05 MED ORDER — FENTANYL CITRATE (PF) 100 MCG/2ML IJ SOLN
INTRAMUSCULAR | Status: DC | PRN
  Administered 2019-09-05: 100 ug via INTRAVENOUS

## 2019-09-05 MED ORDER — LIDOCAINE HCL (PF) 2 % IJ SOLN
INTRAMUSCULAR | Status: AC
  Filled 2019-09-05: qty 10

## 2019-09-05 MED ORDER — LACTATED RINGERS IV SOLN: INTRAVENOUS | Status: DC | PRN

## 2019-09-05 MED ORDER — MIDAZOLAM HCL 2 MG/2ML IJ SOLN
INTRAMUSCULAR | Status: AC
  Filled 2019-09-05: qty 2

## 2019-09-05 MED ORDER — EPHEDRINE SULFATE 50 MG/ML IJ SOLN
INTRAMUSCULAR | Status: DC | PRN
  Administered 2019-09-05 (×2): 10 mg via INTRAVENOUS

## 2019-09-05 MED ORDER — PHENYLEPHRINE HCL (PRESSORS) 10 MG/ML IV SOLN
INTRAVENOUS | Status: DC | PRN
  Administered 2019-09-05: 100 ug via INTRAVENOUS

## 2019-09-05 MED ORDER — ACETAMINOPHEN 10 MG/ML IV SOLN
INTRAVENOUS | Status: DC | PRN
  Administered 2019-09-05: 1000 mg via INTRAVENOUS

## 2019-09-05 MED ORDER — IPRATROPIUM-ALBUTEROL 0.5-2.5 (3) MG/3ML IN SOLN
RESPIRATORY_TRACT | Status: AC
  Filled 2019-09-05: qty 3

## 2019-09-05 MED ORDER — LACTATED RINGERS IV SOLN: INTRAVENOUS | Status: DC

## 2019-09-05 MED ORDER — LIDOCAINE HCL (CARDIAC) PF 100 MG/5ML IV SOSY
PREFILLED_SYRINGE | INTRAVENOUS | Status: DC | PRN
  Administered 2019-09-05: 100 mg via INTRAVENOUS

## 2019-09-05 MED ORDER — FENTANYL CITRATE (PF) 100 MCG/2ML IJ SOLN
25.0000 ug | INTRAMUSCULAR | Status: DC | PRN
  Administered 2019-09-05 (×3): 25 ug via INTRAVENOUS

## 2019-09-05 MED ORDER — FAMOTIDINE 20 MG PO TABS
20.0000 mg | ORAL_TABLET | Freq: Once | ORAL | Status: AC
  Administered 2019-09-05: 20 mg via ORAL

## 2019-09-05 MED ORDER — ROCURONIUM BROMIDE 100 MG/10ML IV SOLN
INTRAVENOUS | Status: DC | PRN
  Administered 2019-09-05: 30 mg via INTRAVENOUS

## 2019-09-05 MED ORDER — INDOCYANINE GREEN 25 MG IV SOLR
1.2500 mg | Freq: Once | INTRAVENOUS | Status: AC
  Administered 2019-09-05: 1.25 mg via INTRAVENOUS
  Filled 2019-09-05: qty 25

## 2019-09-05 MED ORDER — HYDROCODONE-ACETAMINOPHEN 5-325 MG PO TABS: 1.0000 | ORAL_TABLET | Freq: Four times a day (QID) | ORAL | 0 refills | Status: DC | PRN

## 2019-09-05 MED ORDER — SUGAMMADEX SODIUM 200 MG/2ML IV SOLN
INTRAVENOUS | Status: DC | PRN
  Administered 2019-09-05: 200 mg via INTRAVENOUS

## 2019-09-05 MED ORDER — IBUPROFEN 800 MG PO TABS: 800.0000 mg | ORAL_TABLET | Freq: Three times a day (TID) | ORAL | 0 refills | Status: DC | PRN

## 2019-09-05 MED ORDER — KETOROLAC TROMETHAMINE 30 MG/ML IJ SOLN
INTRAMUSCULAR | Status: DC | PRN
  Administered 2019-09-05: 15 mg via INTRAVENOUS

## 2019-09-05 MED ORDER — KETOROLAC TROMETHAMINE 30 MG/ML IJ SOLN
INTRAMUSCULAR | Status: AC
  Filled 2019-09-05: qty 1

## 2019-09-05 MED ORDER — FENTANYL CITRATE (PF) 100 MCG/2ML IJ SOLN
INTRAMUSCULAR | Status: AC
  Administered 2019-09-05: 25 ug via INTRAVENOUS
  Filled 2019-09-05: qty 2

## 2019-09-05 MED ORDER — DEXAMETHASONE SODIUM PHOSPHATE 10 MG/ML IJ SOLN
INTRAMUSCULAR | Status: DC | PRN
  Administered 2019-09-05: 10 mg via INTRAVENOUS

## 2019-09-05 MED ORDER — ACETAMINOPHEN 325 MG PO TABS: 650.0000 mg | ORAL_TABLET | Freq: Three times a day (TID) | ORAL | 0 refills | Status: AC | PRN

## 2019-09-05 MED ORDER — DEXMEDETOMIDINE HCL 200 MCG/2ML IV SOLN
INTRAVENOUS | Status: DC | PRN
  Administered 2019-09-05: 12 ug via INTRAVENOUS

## 2019-09-05 MED ORDER — CEFAZOLIN SODIUM-DEXTROSE 2-4 GM/100ML-% IV SOLN
INTRAVENOUS | Status: AC
  Filled 2019-09-05: qty 100

## 2019-09-05 MED ORDER — ONDANSETRON HCL 4 MG/2ML IJ SOLN
INTRAMUSCULAR | Status: DC | PRN
  Administered 2019-09-05: 4 mg via INTRAVENOUS

## 2019-09-05 MED ORDER — FAMOTIDINE 20 MG PO TABS
ORAL_TABLET | ORAL | Status: AC
  Filled 2019-09-05: qty 1

## 2019-09-05 MED ORDER — LIDOCAINE HCL (PF) 1 % IJ SOLN
INTRAMUSCULAR | Status: AC
  Filled 2019-09-05: qty 30

## 2019-09-05 SURGICAL SUPPLY — 57 items
"PENCIL ELECTRO HAND CTR " (MISCELLANEOUS) ×1 IMPLANT
ANCHOR TIS RET SYS 235ML (MISCELLANEOUS) ×3 IMPLANT
BAG INFUSER PRESSURE 100CC (MISCELLANEOUS) IMPLANT
BLADE SURG SZ11 CARB STEEL (BLADE) ×3 IMPLANT
CANISTER SUCT 1200ML W/VALVE (MISCELLANEOUS) ×3 IMPLANT
CANNULA REDUC XI 12-8 STAPL (CANNULA) ×1
CANNULA REDUC XI 12-8MM STAPL (CANNULA) ×1
CANNULA REDUCER 12-8 DVNC XI (CANNULA) ×1 IMPLANT
CHLORAPREP W/TINT 26 (MISCELLANEOUS) ×3 IMPLANT
CLIP VESOLOCK MED LG 6/CT (CLIP) ×3 IMPLANT
COVER TIP SHEARS 8 DVNC (MISCELLANEOUS) ×1 IMPLANT
COVER TIP SHEARS 8MM DA VINCI (MISCELLANEOUS) ×2
COVER WAND RF STERILE (DRAPES) ×3 IMPLANT
DECANTER SPIKE VIAL GLASS SM (MISCELLANEOUS) ×6 IMPLANT
DEFOGGER SCOPE WARMER CLEARIFY (MISCELLANEOUS) ×3 IMPLANT
DERMABOND ADVANCED (GAUZE/BANDAGES/DRESSINGS) ×2
DERMABOND ADVANCED .7 DNX12 (GAUZE/BANDAGES/DRESSINGS) ×1 IMPLANT
DRAPE 3/4 80X56 (DRAPES) ×3 IMPLANT
DRAPE ARM DVNC X/XI (DISPOSABLE) ×4 IMPLANT
DRAPE COLUMN DVNC XI (DISPOSABLE) ×1 IMPLANT
DRAPE DA VINCI XI ARM (DISPOSABLE) ×8
DRAPE DA VINCI XI COLUMN (DISPOSABLE) ×2
ELECT CAUTERY BLADE 6.4 (BLADE) ×3 IMPLANT
ELECT REM PT RETURN 9FT ADLT (ELECTROSURGICAL) ×3
ELECTRODE REM PT RTRN 9FT ADLT (ELECTROSURGICAL) ×1 IMPLANT
GLOVE BIOGEL PI IND STRL 7.0 (GLOVE) ×2 IMPLANT
GLOVE BIOGEL PI INDICATOR 7.0 (GLOVE) ×4
GLOVE SURG SYN 6.5 ES PF (GLOVE) ×6 IMPLANT
GLOVE SURG SYN 6.5 PF PI (GLOVE) ×2 IMPLANT
GOWN STRL REUS W/ TWL LRG LVL3 (GOWN DISPOSABLE) ×3 IMPLANT
GOWN STRL REUS W/TWL LRG LVL3 (GOWN DISPOSABLE) ×6
GRASPER SUT TROCAR 14GX15 (MISCELLANEOUS) ×3 IMPLANT
IRRIGATOR SUCT 8 DISP DVNC XI (IRRIGATION / IRRIGATOR) IMPLANT
IRRIGATOR SUCTION 8MM XI DISP (IRRIGATION / IRRIGATOR)
IV NS 1000ML (IV SOLUTION)
IV NS 1000ML BAXH (IV SOLUTION) IMPLANT
LABEL OR SOLS (LABEL) ×3 IMPLANT
NDL INSUFFLATION 14GA 120MM (NEEDLE) ×1 IMPLANT
NEEDLE HYPO 22GX1.5 SAFETY (NEEDLE) ×3 IMPLANT
NEEDLE INSUFFLATION 14GA 120MM (NEEDLE) ×3 IMPLANT
NS IRRIG 500ML POUR BTL (IV SOLUTION) ×3 IMPLANT
OBTURATOR OPTICAL STANDARD 8MM (TROCAR) ×2
OBTURATOR OPTICAL STND 8 DVNC (TROCAR) ×1
OBTURATOR OPTICALSTD 8 DVNC (TROCAR) ×1 IMPLANT
PACK LAP CHOLECYSTECTOMY (MISCELLANEOUS) ×3 IMPLANT
PENCIL ELECTRO HAND CTR (MISCELLANEOUS) ×3 IMPLANT
SEAL CANN UNIV 5-8 DVNC XI (MISCELLANEOUS) ×3 IMPLANT
SEAL XI 5MM-8MM UNIVERSAL (MISCELLANEOUS) ×6
SOLUTION ELECTROLUBE (MISCELLANEOUS) ×3 IMPLANT
STAPLER CANNULA SEAL DVNC XI (STAPLE) ×1 IMPLANT
STAPLER CANNULA SEAL XI (STAPLE)
SUT MNCRL 4-0 (SUTURE) ×4
SUT MNCRL 4-0 27XMFL (SUTURE) ×2
SUT VICRYL 0 AB UR-6 (SUTURE) ×3 IMPLANT
SUTURE MNCRL 4-0 27XMF (SUTURE) ×2 IMPLANT
SYR 20ML LL LF (SYRINGE) ×3 IMPLANT
TUBING EVAC SMOKE HEATED PNEUM (TUBING) ×3 IMPLANT

## 2019-09-05 NOTE — Interval H&P Note (Signed)
History and Physical Interval Note:  09/05/2019 7:16 AM  Nathan Merritt  has presented today for surgery, with the diagnosis of K80.20 Cholelithiasis.  The various methods of treatment have been discussed with the patient and family. After consideration of risks, benefits and other options for treatment, the patient has consented to  Procedure(s): XI ROBOTIC [place] (N/A) as a surgical intervention.  The patient's history has been reviewed, patient examined, no change in status, stable for surgery.  I have reviewed the patient's chart and labs.  Questions were answered to the patient's satisfaction.     [name] Lysle Pearl

## 2019-09-05 NOTE — Transfer of Care (Signed)
Immediate Anesthesia Transfer of Care Note  Patient: Nathan Merritt  Procedure(s) Performed: XI ROBOTIC ASSISTED LAPAROSCOPIC CHOLECYSTECTOMY (N/A )   Patient Location: PACU   Anesthesia Type:General  Level of Consciousness: sedated  Airway & Oxygen Therapy: Patient Spontanous Breathing and Patient connected to face mask oxygen  Post-op Assessment: Report given to RN and Post -op Vital signs reviewed and stable  Post vital signs: Reviewed and stable  Last Vitals:  Vitals Value Taken Time  BP 127/82 09/05/19 0951  Temp 36.8 C 09/05/19 0946  Pulse 89 09/05/19 0954  Resp 19 09/05/19 0954  SpO2 95 % 09/05/19 0954  Vitals shown include unvalidated device data.  Last Pain:  Vitals:   09/05/19 0617  TempSrc: Tympanic  PainSc: 0-No pain         Complications: No apparent anesthesia complications

## 2019-09-05 NOTE — Discharge Instructions (Addendum)
Laparoscopic Cholecystectomy, Care After This sheet gives you information about how to care for yourself after your procedure. Your doctor may also give you more specific instructions. If you have problems or questions, contact your doctor. Follow these instructions at home: Care for cuts from surgery (incisions)   Follow instructions from your doctor about how to take care of your cuts from surgery. Make sure you: ? Wash your hands with soap and water before you change your bandage (dressing). If you cannot use soap and water, use hand sanitizer. ? Change your bandage as told by your doctor. ? Leave stitches (sutures), skin glue, or skin tape (adhesive) strips in place. They may need to stay in place for 2 weeks or longer. If tape strips get loose and curl up, you may trim the loose edges. Do not remove tape strips completely unless your doctor says it is okay.  Do not take baths, swim, or use a hot tub until your doctor says it is okay. OK TO SHOWER 24HRS AFTER YOUR SURGERY.   Check your surgical cut area every day for signs of infection. Check for: ? More redness, swelling, or pain. ? More fluid or blood. ? Warmth. ? Pus or a bad smell. Activity  Do not drive or use heavy machinery while taking prescription pain medicine.  Do not play contact sports until your doctor says it is okay.  Do not drive for 24 hours if you were given a medicine to help you relax (sedative).  Rest as needed. Do not return to work or school until your doctor says it is okay. General instructions .  tylenol and advil as needed for discomfort.  Please alternate between the two every four hours as needed for pain.   .  Use narcotics, if prescribed, only when tylenol and motrin is not enough to control pain. .  325-650mg every 8hrs to max of 3000mg/24hrs (including the 325mg in every norco dose) for the tylenol.   .  Advil up to 800mg per dose every 8hrs as needed for pain.    To prevent or treat constipation  while you are taking prescription pain medicine, your doctor may recommend that you: ? Drink enough fluid to keep your pee (urine) clear or pale yellow. ? Take over-the-counter or prescription medicines. ? Eat foods that are high in fiber, such as fresh fruits and vegetables, whole grains, and beans. ? Limit foods that are high in fat and processed sugars, such as fried and sweet foods. Contact a doctor if:  You develop a rash.  You have more redness, swelling, or pain around your surgical cuts.  You have more fluid or blood coming from your surgical cuts.  Your surgical cuts feel warm to the touch.  You have pus or a bad smell coming from your surgical cuts.  You have a fever.  One or more of your surgical cuts breaks open. Get help right away if:  You have trouble breathing.  You have chest pain.  You have pain that is getting worse in your shoulders.  You faint or feel dizzy when you stand.  You have very bad pain in your belly (abdomen).  You are sick to your stomach (nauseous) for more than one day.  You have throwing up (vomiting) that lasts for more than one day.  You have leg pain. This information is not intended to replace advice given to you by your health care provider. Make sure you discuss any questions you have with your   health care provider. Document Released: 05/16/2008 Document Revised: 02/26/2016 Document Reviewed: 01/24/2016 Elsevier Interactive Patient Education  2019 [employer]   AMBULATORY SURGERY  DISCHARGE INSTRUCTIONS   1) The drugs that you were given will stay in your system until tomorrow so for the next 24 hours you should not:  A) Drive an automobile B) Make any legal decisions C) Drink any alcoholic beverage   2) You may resume regular meals tomorrow.  Today it is better to start with liquids and gradually work up to solid foods.  You may eat anything you prefer, but it is better to start with liquids, then soup and crackers,  and gradually work up to solid foods.   3) Please notify your doctor immediately if you have any unusual bleeding, trouble breathing, redness and pain at the surgery site, drainage, fever, or pain not relieved by medication.    4) Additional Instructions:        Please contact your physician with any problems or Same Day Surgery at [phone], Monday through Friday 6 am to 4 pm, or [place] [facility] Main number at [phone]. 

## 2019-09-05 NOTE — Progress Notes (Signed)
Pt having couplets and frequent pvc's   Sometimes pvc's occur every 5 beat

## 2019-09-05 NOTE — Progress Notes (Signed)
Up in chair sat on room air ranges from 84 to 98    Now sat 96

## 2019-09-05 NOTE — Op Note (Signed)
Preoperative diagnosis:  biliary colic  Postoperative diagnosis: same as above  Procedure: Robotic assisted Laparoscopic Cholecystectomy.   Anesthesia: GETA   Surgeon: Benjamine Sprague  Specimen: Gallbladder  Complications: None  EBL: 68mL  Wound Classification: Clean Contaminated  Indications: see HPI  Findings: Critical view of safety noted Cystic duct and artery identified, ligated and divided, clips remained intact at end of procedure Adequate hemostasis  Description of procedure:  The patient was placed on the operating table in the supine position. SCDs placed, pre-op abx administered.  General anesthesia was induced and OG tube placed by anesthesia. A time-out was completed verifying correct patient, procedure, site, positioning, and implant(s) and/or special equipment prior to beginning this procedure. The abdomen was prepped and draped in the usual sterile fashion.    Veress needle was placed at the Palmer's point and insufflation was started after confirming a positive saline drop test and no immediate increase in abdominal pressure.  After reaching 15 mm, the Veress needle was removed and a 8 mm port was placed via optiview technique under umbilicus measured 0000000 from gallbladder.  The abdomen was inspected and no abnormalities or injuries were found.  Under direct vision, ports were placed in the following locations: One 12 mm patient left of the umbilicus, 8cm from the optiviewed port, one 8 mm port placed to the patient right of the umbilical port 8 cm apart.  1 additional 8 mm port placed lateral to the 22mm port.  Once ports were placed, The table was placed in the reverse Trendelenburg position with the right side up. The Xi platform was brought into the operative field and docked to the ports successfully.  An endoscope was placed through the umbilical port, fenestrated grasper through the adjacent patient right port, prograsp to the far patient left port, and then a hook  cautery in the left port.  Extensive adhesions betweeen omentum and liver lysed with hook cautery prior to visualizing the gallbladder.  The dome of the gallbladder was grasped with prograsp, passed and retracted over the dome of the liver. Adhesions between the gallbladder and omentum, duodenum and transverse colon were lysed via hook cautery. The infundibulum was grasped with the fenestrated grasper and retracted toward the right lower quadrant. This maneuver exposed Calot's triangle. The peritoneum overlying the gallbladder infundibulum was then dissected using combination of [place] dissector and electrocautery hook and the cystic duct and cystic artery identified.  Critical view of safety with the liver bed clearly visible behind the duct and artery with no additional structures noted.  The cystic duct and cystic artery clipped and divided close to the gallbladder.  1 robotic clips were placed at the base of the cystic duct, one clip for the cystic artery.   The gallbladder was then dissected from its peritoneal and liver bed attachments by electrocautery. Hemostasis was checked prior to removing the pro-grasp retractor and placing the endoscope through the port it was in.  The Endo Catch bag was then placed through the 12 mm port and the gallbladder was removed.  The gallbladder was passed off the table as a specimen. There was no evidence of bleeding from the gallbladder fossa or cystic artery or leakage of the bile from the cystic duct stump. . Abdomen desufflated and secondary trocars were removed under direct vision. The 12 mm port site closed with 0 vicryl in a running fashion. 3-0 vicryl used in interrupted fashion for subdermal layer. No bleeding was noted. All skin incisions then closed with subcuticular sutures  of 4-0 monocryl and dressed with topical skin adhesive. The orogastric tube was removed and patient extubated.  The patient tolerated the procedure well and was taken to the  postanesthesia care unit in stable condition.  All sponge and instrument count correct at end of procedure.

## 2019-09-05 NOTE — Anesthesia Postprocedure Evaluation (Signed)
Anesthesia Post Note  Patient: Nathan Merritt  Procedure(s) Performed: XI ROBOTIC ASSISTED LAPAROSCOPIC CHOLECYSTECTOMY (N/A )  Patient location during evaluation: PACU Anesthesia Type: General Level of consciousness: awake and alert Pain management: pain level controlled Vital Signs Assessment: post-procedure vital signs reviewed and stable Respiratory status: spontaneous breathing, nonlabored ventilation, respiratory function stable and patient connected to nasal cannula oxygen Cardiovascular status: blood pressure returned to baseline and stable Postop Assessment: no apparent nausea or vomiting Anesthetic complications: no     Last Vitals:  Vitals:   09/05/19 1217 09/05/19 1242  BP: 125/80 (!) 159/85  Pulse: 82 96  Resp: 16 16  Temp: (!) 36.4 C   SpO2: 98% 97%    Last Pain:  Vitals:   09/05/19 1242  TempSrc:   PainSc: 2                  Nathan Merritt

## 2019-09-05 NOTE — Progress Notes (Signed)
Pain level 4 out of 10  Po pain medication given  Taking liquid and crackers
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
## 2019-09-05 NOTE — Progress Notes (Signed)
Duo neb given for low sats   Dr piscitello has looked at old EKG  Pt has had pvc's before   Dr piscitello fine with report of this

## 2019-09-05 NOTE — Anesthesia Procedure Notes (Signed)
Procedure Name: Intubation Date/Time: 09/05/2019 7:34 AM Performed by: Justus Memory, CRNA Pre-anesthesia Checklist: Patient identified, Patient being monitored, Timeout performed, Emergency Drugs available and Suction available Patient Re-evaluated:Patient Re-evaluated prior to induction Oxygen Delivery Method: Circle system utilized Preoxygenation: Pre-oxygenation with 100% oxygen Induction Type: IV induction Ventilation: Mask ventilation without difficulty Laryngoscope Size: Mac, 3 and McGraph Grade View: Grade I Tube type: Oral Tube size: 7.0 mm Number of attempts: 1 Airway Equipment and Method: Stylet and Video-laryngoscopy Placement Confirmation: ETT inserted through vocal cords under direct vision,  positive ETCO2 and breath sounds checked- equal and bilateral Secured at: 21 cm Tube secured with: Tape Dental Injury: Teeth and Oropharynx as per pre-operative assessment

## 2019-09-08 LAB — SURGICAL PATHOLOGY

## 2019-09-17 ENCOUNTER — Other Ambulatory Visit: Payer: Self-pay

## 2019-09-17 ENCOUNTER — Encounter: Payer: Self-pay | Admitting: Urology

## 2019-09-17 ENCOUNTER — Ambulatory Visit: Payer: Medicare PPO | Admitting: Urology

## 2019-09-17 VITALS — BP 139/86 | HR 94

## 2019-09-17 DIAGNOSIS — N529 Male erectile dysfunction, unspecified: Secondary | ICD-10-CM | POA: Diagnosis not present

## 2019-09-17 NOTE — Progress Notes (Signed)
Mr. Bingman presents today for a EDEX titration.  He is still having spontaneous erections.   He has had no response to PDE5i's.  He denies any history of sickle cell anemia or trait, a history of multiple myeloma or a history of leukemia.   He has not taken trazodone or a PDE5i's today.    Patient's left corpus cavernosum is identified.  An area near the base of the penis is cleansed with rubbing alcohol.  Careful to avoid the dorsal vein, 2 mcg of EDEX 20  (Lot # [id number] exp # 03/2020) is injected at a 90 degree angle into the left corpus cavernosum near the base of the penis.  Patient experienced penile fullness in 15 minutes.    Then the patient's right corpus cavernosum is identified.  An area near the base of the penis is cleansed with rubbing alcohol.  Careful to avoid the dorsal vein, 2 mcg of EDEX 20 is injected at a 90 degree angle into the right corpus cavernosum near the base of the penis.  Patient experienced penile fullness in 15 minutes.  Then the patient's left corpus cavernosum is identified.  An area near the base of the penis is cleansed with rubbing alcohol.  Careful to avoid the dorsal vein, 2 mcg of EDEX 20 is injected at a 90 degree angle into the left corpus cavernosum near the base of the penis.    Advised patient of the condition of priapism, painful erection lasting for more than four hours, and to contact the office immediately or seek treatment in the ED

## 2019-11-07 ENCOUNTER — Ambulatory Visit: Payer: Medicare PPO | Attending: Internal Medicine

## 2019-11-07 DIAGNOSIS — Z23 Encounter for immunization: Secondary | ICD-10-CM

## 2019-11-07 NOTE — Progress Notes (Signed)
   Covid-19 Vaccination Clinic  Name:  TIMMIE DINSDALE    MRN: [id number] DOB: 11/24/1945  11/07/2019  Mr. Hirt was observed post Covid-19 immunization for 15 minutes without incident. He was provided with Vaccine Information Sheet and instruction to access the V-Safe system.   Mr. Rohn was instructed to call 911 with any severe reactions post vaccine: Marland Kitchen Difficulty breathing  . Swelling of face and throat  . A fast heartbeat  . A bad rash all over body  . Dizziness and weakness   Immunizations Administered    Name Date Dose VIS Date Route   Pfizer COVID-19 Vaccine 11/07/2019  8:03 AM 0.3 mL 08/01/2019 Intramuscular   Manufacturer: [place]   Lot: [id number]   [place]: [id number]

## 2019-12-03 ENCOUNTER — Ambulatory Visit: Payer: Medicare PPO | Attending: Internal Medicine

## 2019-12-03 DIAGNOSIS — Z23 Encounter for immunization: Secondary | ICD-10-CM

## 2019-12-03 NOTE — Progress Notes (Signed)
   Covid-19 Vaccination Clinic  Name:  DEAVEON LASSONDE    MRN: [id number] DOB: [birth year]  12/03/2019  Mr. Lefebure was observed post Covid-19 immunization for 15 minutes without incident. He was provided with Vaccine Information Sheet and instruction to access the V-Safe system.   Mr. Strey was instructed to call 911 with any severe reactions post vaccine: Marland Kitchen Difficulty breathing  . Swelling of face and throat  . A fast heartbeat  . A bad rash all over body  . Dizziness and weakness   Immunizations Administered    Name Date Dose VIS Date Route   Pfizer COVID-19 Vaccine 12/03/2019  8:03 AM 0.3 mL 08/01/2019 Intramuscular   Manufacturer: [place]   Lot: [id number]   [place]: [id number]

## 2020-06-01 IMAGING — DX PORTABLE RIGHT KNEE - 1-2 VIEW
2 series · 2 of 2 positions shown · non-contrast
Comparison: None.

CLINICAL DATA: Right knee pain

EXAM:
PORTABLE RIGHT KNEE - 1-2 VIEW

[knee ap]
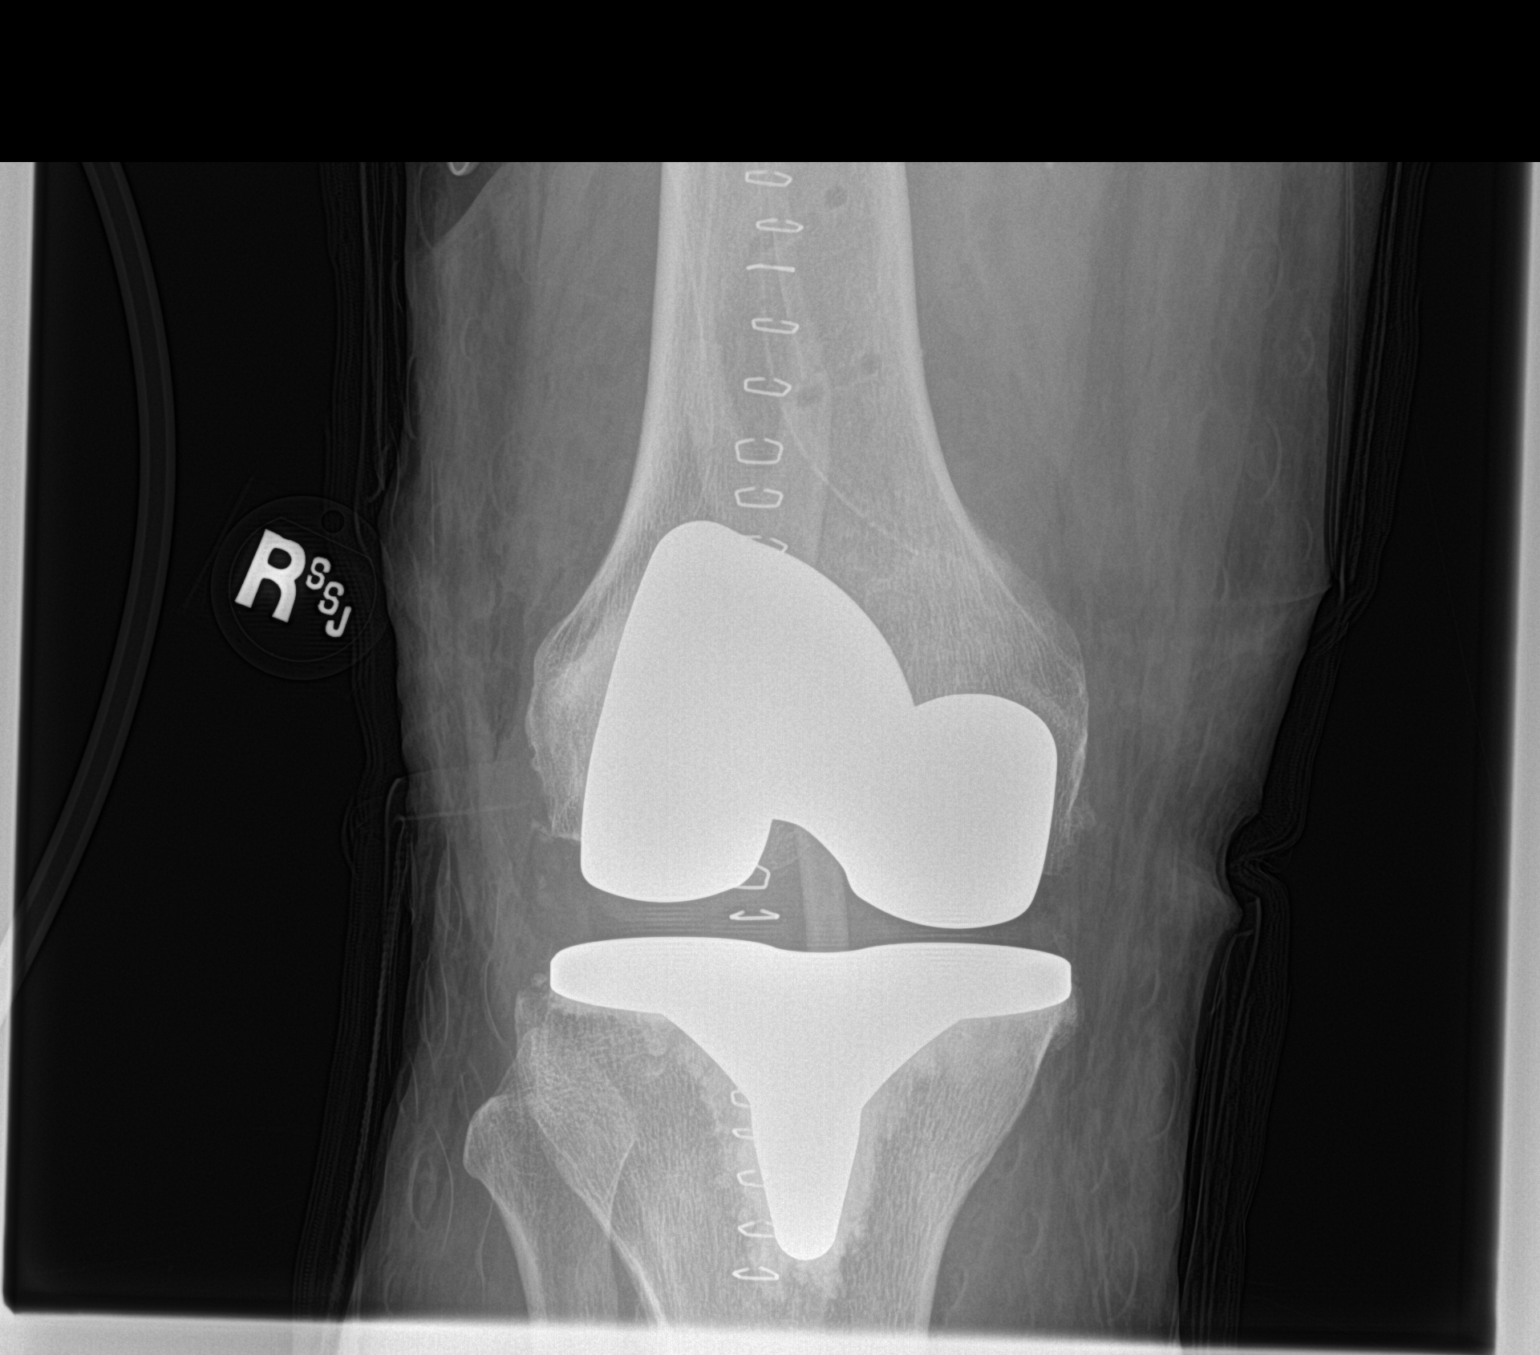

[knee lat]
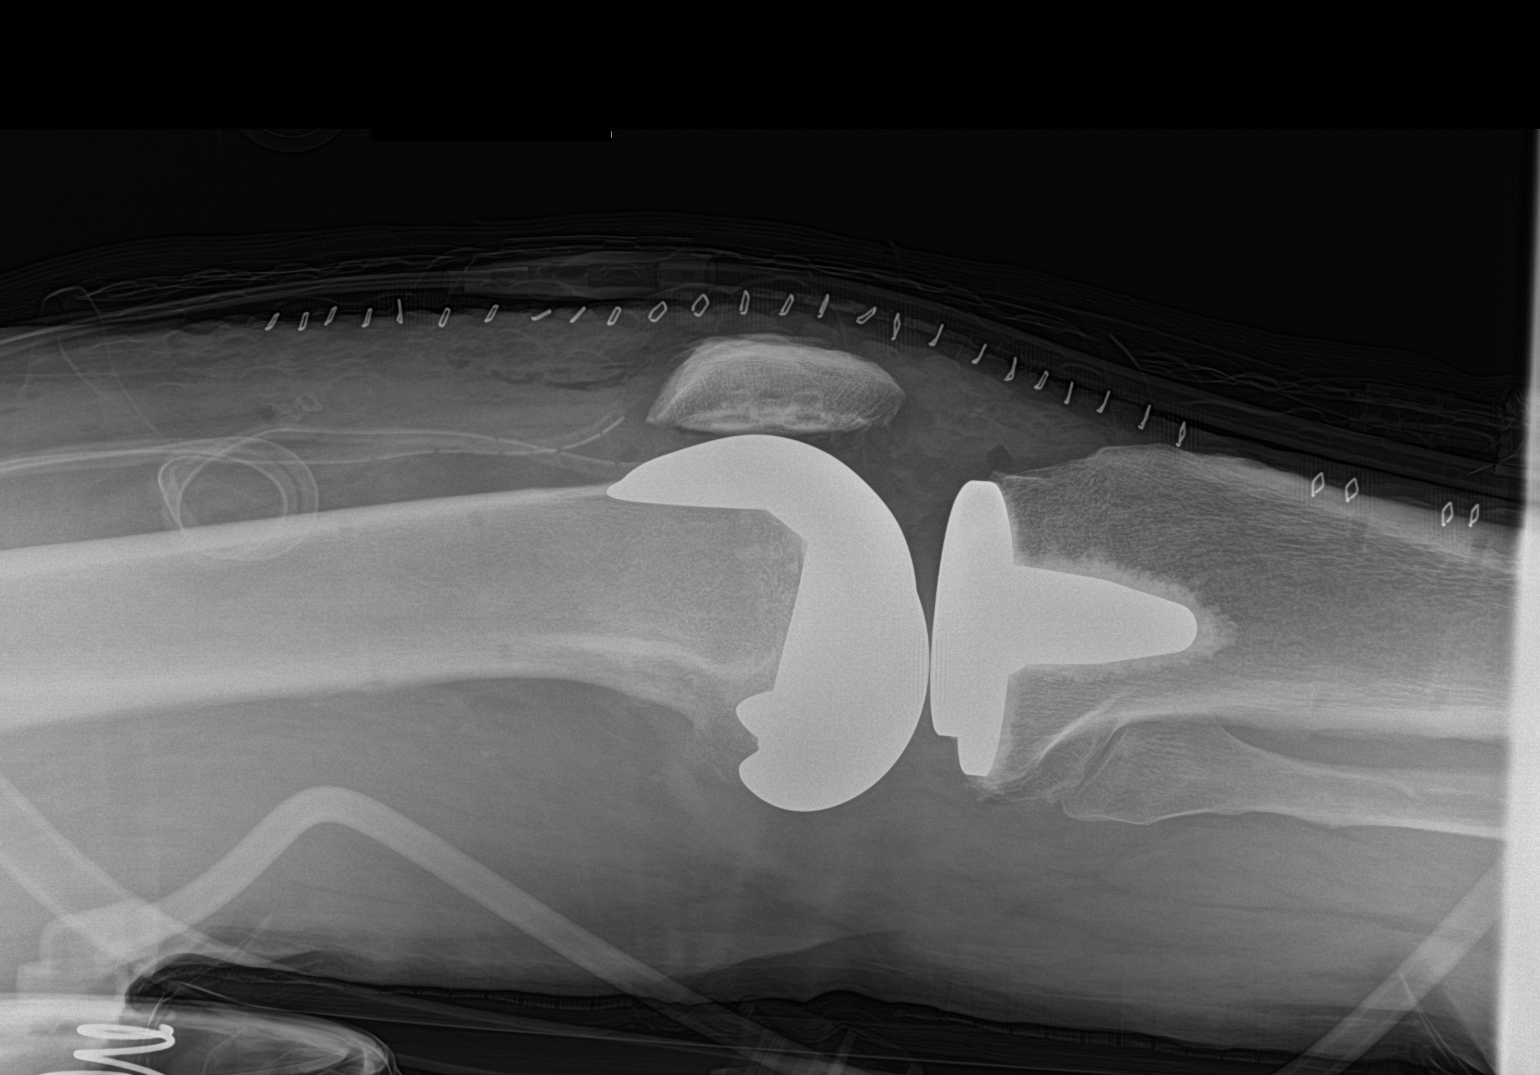

[2 of 2 positions shown; findings below may reference images not displayed]

FINDINGS: The right knee demonstrates a total knee arthroplasty without
evidence of hardware failure complication. There is no significant
joint effusion. There is no fracture or dislocation. The alignment
is anatomic. Surgical drains are present. Post-surgical changes
noted in the surrounding soft tissues.
IMPRESSION: Interval right total knee arthroplasty.
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
## 2020-10-28 ENCOUNTER — Ambulatory Visit: Payer: Medicare PPO | Admitting: Urology

## 2020-10-28 ENCOUNTER — Encounter: Payer: Self-pay | Admitting: Urology

## 2020-10-28 ENCOUNTER — Other Ambulatory Visit: Payer: Self-pay

## 2020-10-28 VITALS — BP 151/93 | HR 80 | Ht 75.0 in | Wt 258.3 lb

## 2020-10-28 DIAGNOSIS — N401 Enlarged prostate with lower urinary tract symptoms: Secondary | ICD-10-CM

## 2020-10-28 DIAGNOSIS — N138 Other obstructive and reflux uropathy: Secondary | ICD-10-CM

## 2020-10-28 DIAGNOSIS — N529 Male erectile dysfunction, unspecified: Secondary | ICD-10-CM | POA: Diagnosis not present

## 2020-10-28 DIAGNOSIS — R972 Elevated prostate specific antigen [PSA]: Secondary | ICD-10-CM | POA: Diagnosis not present

## 2020-10-28 LAB — URINALYSIS, COMPLETE
Bilirubin, UA: NEGATIVE
Glucose, UA: NEGATIVE
Ketones, UA: NEGATIVE
Leukocytes,UA: NEGATIVE
Nitrite, UA: NEGATIVE
RBC, UA: NEGATIVE
Specific Gravity, UA: 1.025 (ref 1.005–1.030)
Urobilinogen, Ur: 0.2 mg/dL (ref 0.2–1.0)
pH, UA: 5.5 (ref 5.0–7.5)

## 2020-10-28 LAB — MICROSCOPIC EXAMINATION
Bacteria, UA: NONE SEEN
RBC, Urine: NONE SEEN /hpf (ref 0–2)
WBC, UA: NONE SEEN /hpf (ref 0–5)

## 2020-10-30 LAB — PSA: Prostate Specific Ag, Serum: 11.7 ng/mL — ABNORMAL HIGH (ref 0.0–4.0)

## 2020-11-01 ENCOUNTER — Telehealth: Payer: Self-pay | Admitting: Family Medicine

## 2020-11-01 NOTE — Telephone Encounter (Signed)
-----   Message from Nori Riis, PA-C sent at 10/31/2020  7:54 PM EDT ----- Please let Mr. Bacci know that his PSA is stable.

## 2020-11-01 NOTE — Telephone Encounter (Signed)
Patient notified and voiced understanding.

## 2020-11-03 IMAGING — US US ABDOMEN LIMITED
1 series · 14 of 25 positions shown · non-contrast
Comparison: None.

CLINICAL DATA: 73-year-old male with elevated LFTs.

EXAM:
ULTRASOUND ABDOMEN LIMITED RIGHT UPPER QUADRANT

[Series 1: us abdomen limited · 0.23mm/px · 14 of 57 slices shown]
[im 1/57]
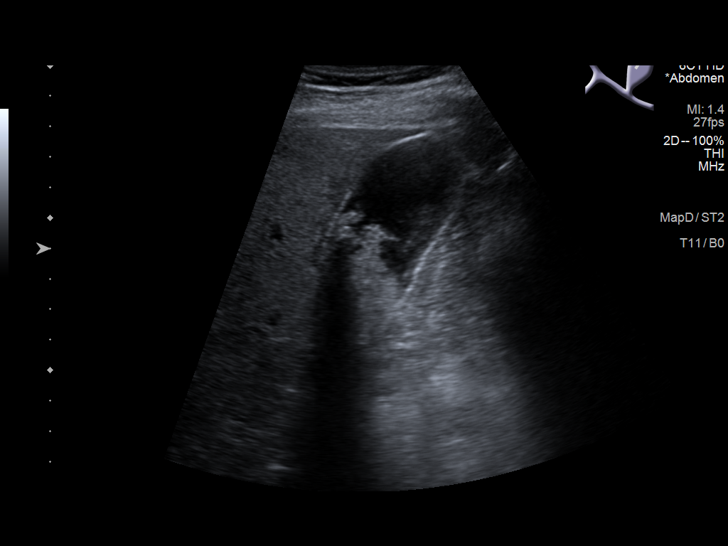
[im 5/57]
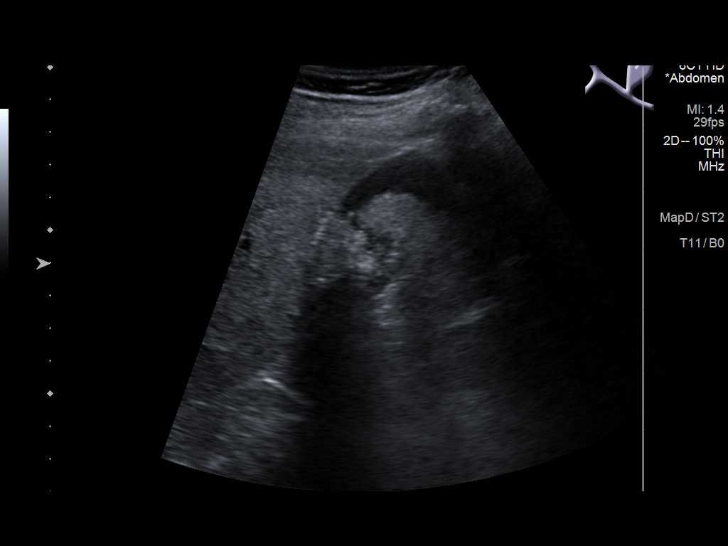
[im 10/57]
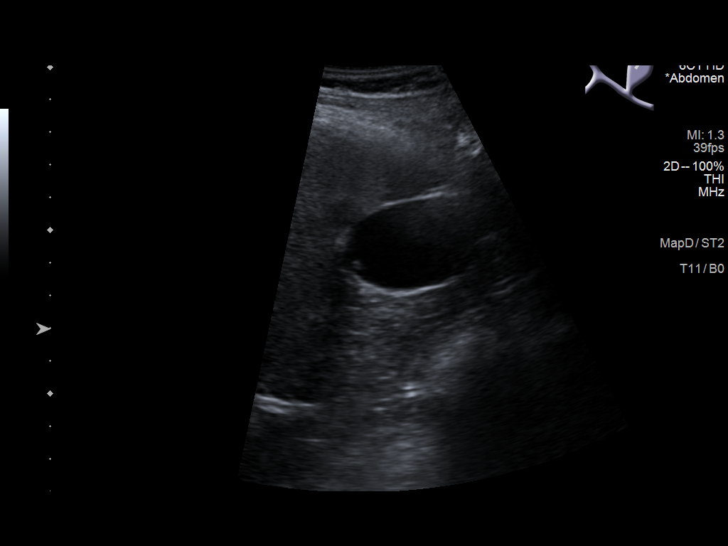
[im 15/57]
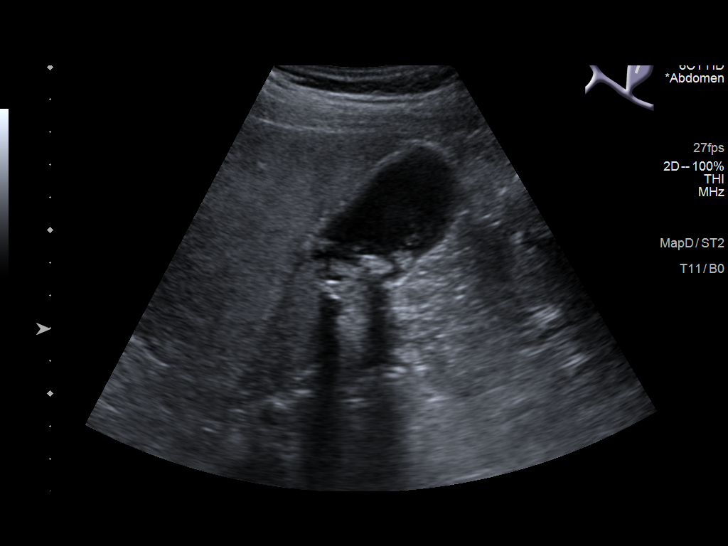
[im 19/57]
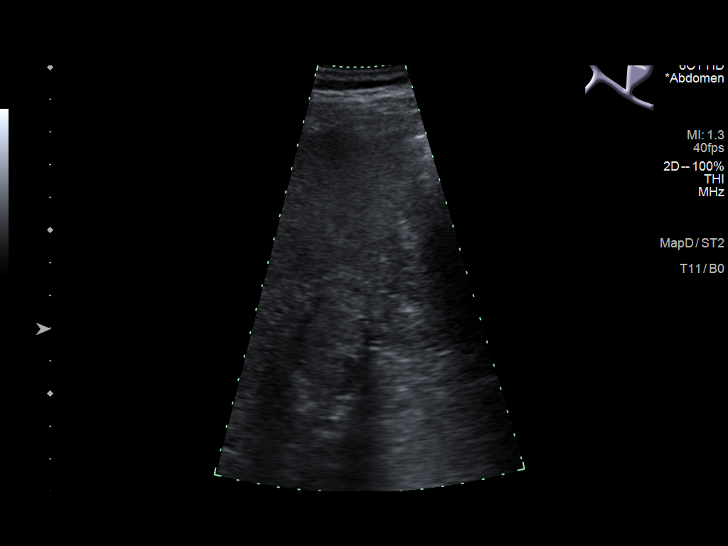
[im 22/57]
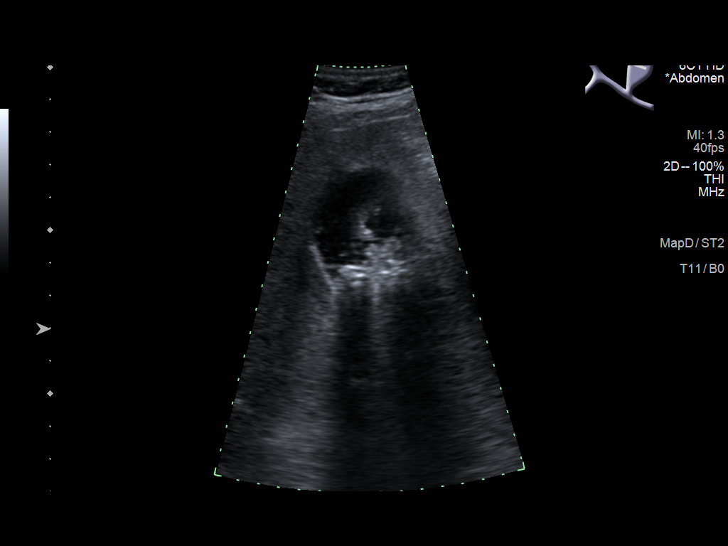
[im 26/57]
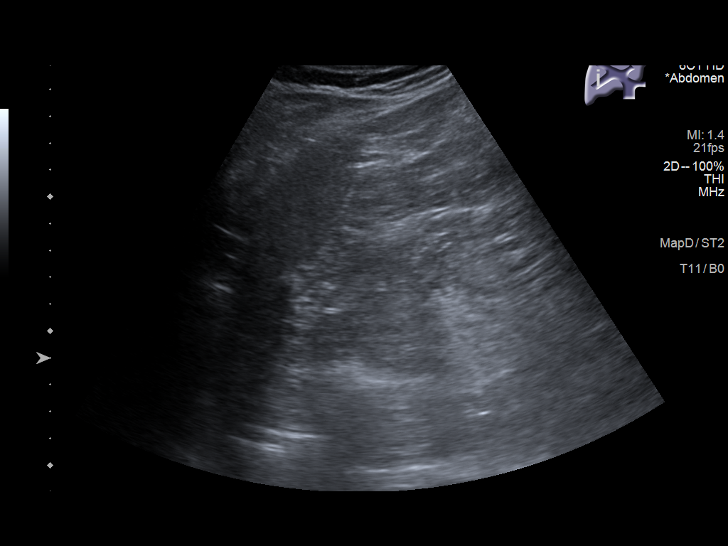
[im 31/57]
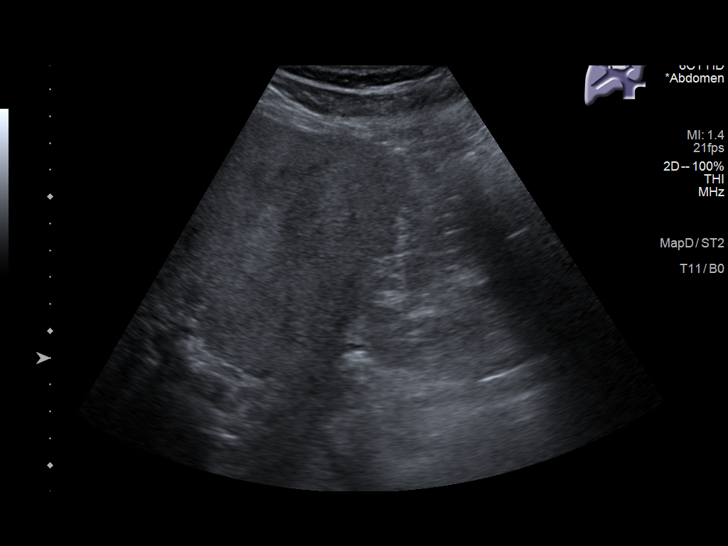
[im 36/57]
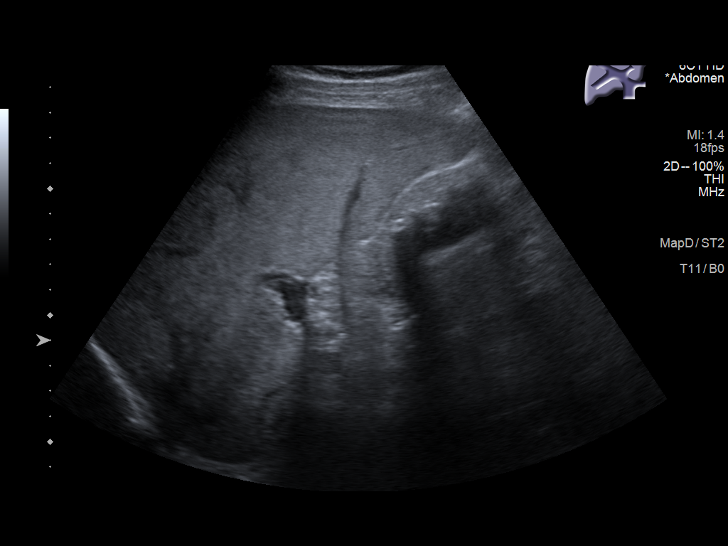
[im 38/57]
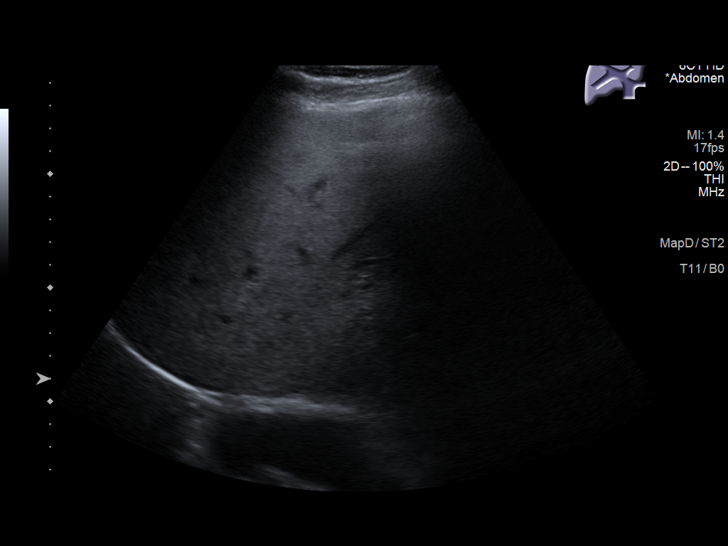
[im 43/57]
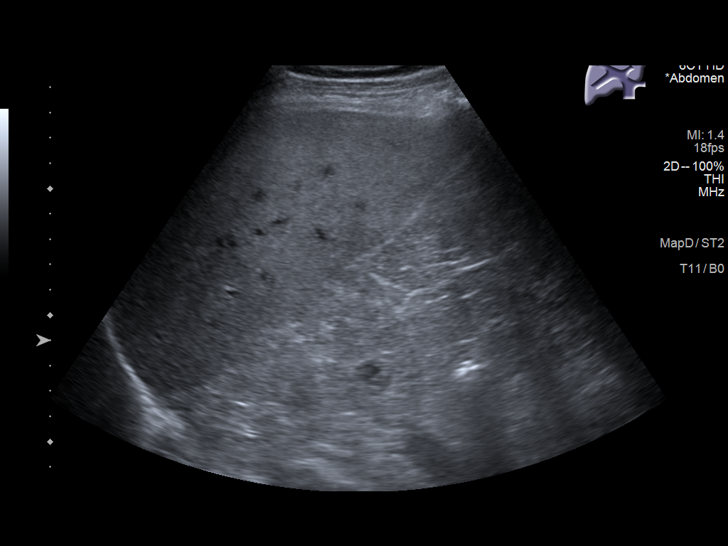
[im 47/57]
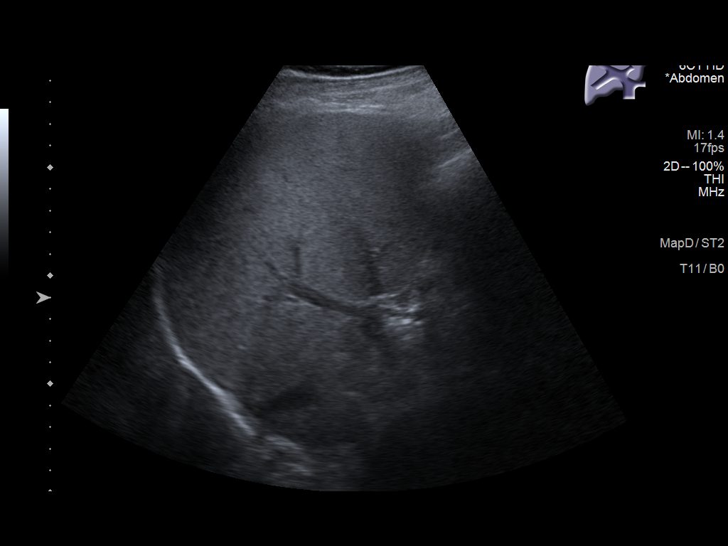
[im 52/57]
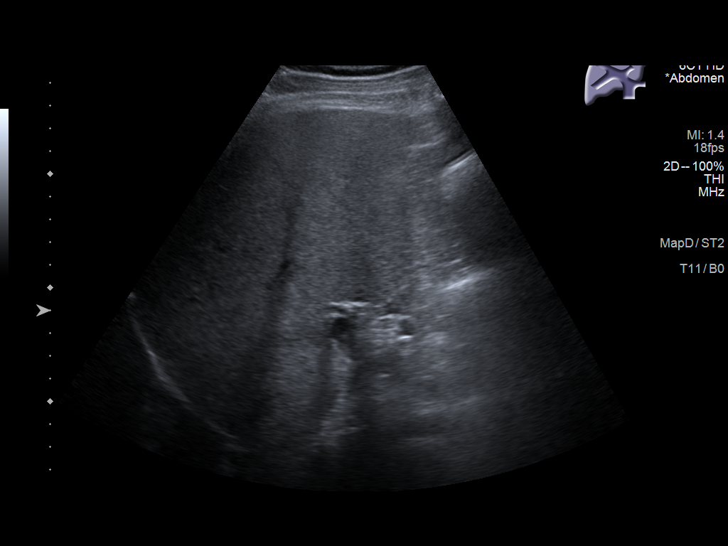
[im 57/57]
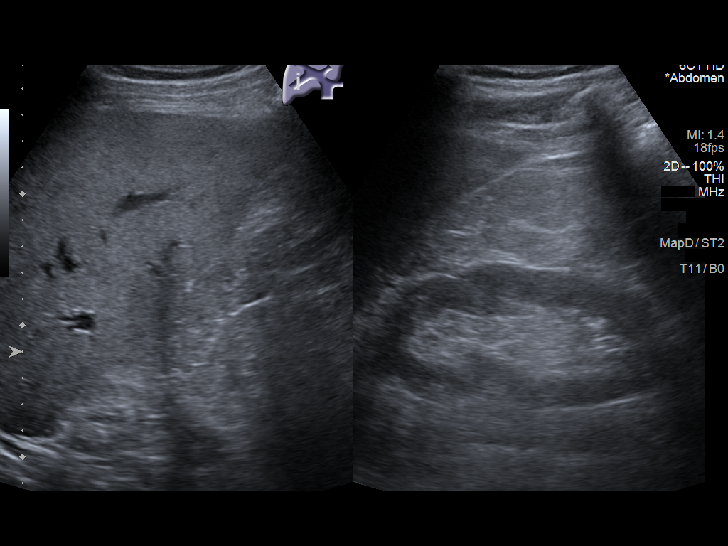

[14 of 25 positions shown; findings below may reference images not displayed]

FINDINGS: Gallbladder:

Large amount of sludge as well as stones noted within the
gallbladder. There is no gallbladder wall thickening or
pericholecystic fluid. Negative sonographic Murphy's sign.

Common bile duct:

Diameter: 4 mm

Liver:

There is diffuse increased liver echogenicity most commonly seen in
the setting of fatty infiltration. Superimposed inflammation or
fibrosis is not excluded. Clinical correlation is recommended.
Portal vein is patent on color Doppler imaging with normal direction
of blood flow towards the liver.

Other: None.
IMPRESSION: 1. Gallbladder sludge and stones. No sonographic findings of acute
cholecystitis.
2. Fatty liver.

## 2021-07-21 ENCOUNTER — Other Ambulatory Visit: Payer: Self-pay

## 2021-07-21 ENCOUNTER — Ambulatory Visit: Payer: Medicare Other | Admitting: Urology

## 2021-07-21 ENCOUNTER — Encounter: Payer: Self-pay | Admitting: Urology

## 2021-07-21 VITALS — BP 162/94 | HR 76 | Ht 75.0 in | Wt 245.0 lb

## 2021-07-21 DIAGNOSIS — N138 Other obstructive and reflux uropathy: Secondary | ICD-10-CM

## 2021-07-21 DIAGNOSIS — R972 Elevated prostate specific antigen [PSA]: Secondary | ICD-10-CM

## 2021-07-21 DIAGNOSIS — N401 Enlarged prostate with lower urinary tract symptoms: Secondary | ICD-10-CM

## 2021-07-21 DIAGNOSIS — R361 Hematospermia: Secondary | ICD-10-CM
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
## 2021-10-21 ENCOUNTER — Ambulatory Visit: Payer: Medicare Other | Admitting: Urology

## 2021-11-03 ENCOUNTER — Encounter: Payer: Self-pay | Admitting: *Deleted

## 2021-11-04 ENCOUNTER — Ambulatory Visit: Payer: Medicare Other | Admitting: Anesthesiology

## 2021-11-04 ENCOUNTER — Ambulatory Visit
Admission: RE | Admit: 2021-11-04 | Discharge: 2021-11-04 | Disposition: A | Discharge status: Home / Self Care | Payer: Medicare Other | Attending: Gastroenterology | Admitting: Gastroenterology

## 2021-11-04 ENCOUNTER — Encounter
Admission: RE | Disposition: A | Discharge status: Home / Self Care | Payer: Self-pay | Source: Home / Self Care | Attending: Gastroenterology

## 2021-11-04 ENCOUNTER — Encounter: Payer: Self-pay | Admitting: Anesthesiology

## 2021-11-04 DIAGNOSIS — Q438 Other specified congenital malformations of intestine: Secondary | ICD-10-CM | POA: Diagnosis not present

## 2021-11-04 DIAGNOSIS — D120 Benign neoplasm of cecum: Secondary | ICD-10-CM | POA: Insufficient documentation

## 2021-11-04 DIAGNOSIS — Z9049 Acquired absence of other specified parts of digestive tract: Secondary | ICD-10-CM | POA: Insufficient documentation

## 2021-11-04 DIAGNOSIS — Z79899 Other long term (current) drug therapy: Secondary | ICD-10-CM | POA: Insufficient documentation

## 2021-11-04 DIAGNOSIS — D123 Benign neoplasm of transverse colon: Secondary | ICD-10-CM | POA: Diagnosis not present

## 2021-11-04 DIAGNOSIS — Z96651 Presence of right artificial knee joint: Secondary | ICD-10-CM | POA: Insufficient documentation

## 2021-11-04 DIAGNOSIS — K640 First degree hemorrhoids: Secondary | ICD-10-CM | POA: Insufficient documentation

## 2021-11-04 DIAGNOSIS — Z1211 Encounter for screening for malignant neoplasm of colon: Secondary | ICD-10-CM | POA: Insufficient documentation

## 2021-11-04 DIAGNOSIS — I10 Essential (primary) hypertension: Secondary | ICD-10-CM | POA: Insufficient documentation

## 2021-11-04 DIAGNOSIS — H409 Unspecified glaucoma: Secondary | ICD-10-CM | POA: Diagnosis not present

## 2021-11-04 HISTORY — DX: Personal history of other infectious and parasitic diseases: Z86.19

## 2021-11-04 HISTORY — DX: Benign neoplasm of colon, unspecified: D12.6

## 2021-11-04 HISTORY — PX: COLONOSCOPY WITH PROPOFOL: SHX5780

## 2021-11-04 SURGERY — COLONOSCOPY WITH PROPOFOL
Anesthesia: General

## 2021-11-04 MED ORDER — SODIUM CHLORIDE 0.9 % IV SOLN
INTRAVENOUS | Status: DC
  Administered 2021-11-04: 20 mL/h via INTRAVENOUS

## 2021-11-04 MED ORDER — PROPOFOL 500 MG/50ML IV EMUL
INTRAVENOUS | Status: AC
  Filled 2021-11-04: qty 50

## 2021-11-04 MED ORDER — SPOT INK MARKER SYRINGE KIT
PACK | SUBMUCOSAL | Status: DC | PRN
  Administered 2021-11-04: 2 mL via SUBMUCOSAL

## 2021-11-04 MED ORDER — PHENYLEPHRINE 40 MCG/ML (10ML) SYRINGE FOR IV PUSH (FOR BLOOD PRESSURE SUPPORT)
PREFILLED_SYRINGE | INTRAVENOUS | Status: AC
  Filled 2021-11-04: qty 10

## 2021-11-04 MED ORDER — PHENYLEPHRINE 40 MCG/ML (10ML) SYRINGE FOR IV PUSH (FOR BLOOD PRESSURE SUPPORT)
PREFILLED_SYRINGE | INTRAVENOUS | Status: DC | PRN
  Administered 2021-11-04 (×4): 40 ug via INTRAVENOUS

## 2021-11-04 MED ORDER — PROPOFOL 500 MG/50ML IV EMUL
INTRAVENOUS | Status: DC | PRN
  Administered 2021-11-04: 120 ug/kg/min via INTRAVENOUS
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
## 2021-11-04 NOTE — Op Note (Signed)
[facility] ?Gastroenterology ?Patient Name: Nathan Merritt ?Procedure Date: 11/04/2021 10:20 AM ?MRN: [id number] ?Account #: [id number] ?Date of Birth: Jan 30, 1946 ?Admit Type: Outpatient ?Age: 76 ?Room: [facility] ENDO ROOM 3 ?Gender: Male ?Note Status: Finalized ?Instrument Name: Colonoscope [id number] ?Procedure:             Colonoscopy ?Indications:           Surveillance: Personal history of adenomatous polyps  ?                       on last colonoscopy > 5 years ago ?Providers:             Andrey Farmer MD, MD ?Medicines:             Monitored Anesthesia Care ?Complications:         No immediate complications. Estimated blood loss:  ?                       Minimal. ?Procedure:             Pre-Anesthesia Assessment: ?                       - Prior to the procedure, a History and Physical was  ?                       performed, and patient medications and allergies were  ?                       reviewed. The patient is competent. The risks and  ?                       benefits of the procedure and the sedation options and  ?                       risks were discussed with the patient. All questions  ?                       were answered and informed consent was obtained.  ?                       Patient identification and proposed procedure were  ?                       verified by the physician, the nurse, the  ?                       anesthesiologist, the anesthetist and the technician  ?                       in the endoscopy suite. Mental Status Examination:  ?                       alert and oriented. Airway Examination: normal  ?                       oropharyngeal airway and neck mobility. Respiratory  ?                       Examination: clear to auscultation. CV Examination:  ?  normal. Prophylactic Antibiotics: The patient does not  ?                       require prophylactic antibiotics. Prior  ?                       Anticoagulants: The patient has taken no previous  ?                        anticoagulant or antiplatelet agents. ASA Grade  ?                       Assessment: II - A patient with mild systemic disease.  ?                       After reviewing the risks and benefits, the patient  ?                       was deemed in satisfactory condition to undergo the  ?                       procedure. The anesthesia plan was to use monitored  ?                       anesthesia care (MAC). Immediately prior to  ?                       administration of medications, the patient was  ?                       re-assessed for adequacy to receive sedatives. The  ?                       heart rate, respiratory rate, oxygen saturations,  ?                       blood pressure, adequacy of pulmonary ventilation, and  ?                       response to care were monitored throughout the  ?                       procedure. The physical status of the patient was  ?                       re-assessed after the procedure. ?                       After obtaining informed consent, the colonoscope was  ?                       passed under direct vision. Throughout the procedure,  ?                       the patient's blood pressure, pulse, and oxygen  ?                       saturations were monitored continuously. The  ?  Colonoscope was introduced through the anus and  ?                       advanced to the the cecum, identified by appendiceal  ?                       orifice and ileocecal valve. The colonoscopy was  ?                       somewhat difficult due to a redundant colon. The  ?                       patient tolerated the procedure well. The quality of  ?                       the bowel preparation was good. ?Findings: ?     The perianal and digital rectal examinations were normal. ?     Two sessile polyps were found in the cecum. The polyps were 2 to 5 mm in  ?     size. These polyps were removed with a cold snare. Resection and  ?     retrieval were complete.  Estimated blood loss was minimal. ?     A 13 mm polyp was found in the proximal transverse colon. The polyp was  ?     multi-lobulated. The polyp was removed with a saline injection-lift  ?     technique using a hot snare. The polyp was removed with a piecemeal  ?     technique using a hot snare. Resection and retrieval were complete. To  ?     prevent bleeding post-intervention, two hemostatic clips were  ?     successfully placed. There was no bleeding during, or at the end, of the  ?     procedure. Area was tattooed with an injection of [place] ink on opposite  ?     colon wall with a small amount if ink. ?     A 10 mm polyp was found in the proximal transverse colon. The polyp was  ?     sessile. The polyp was removed with a hot snare. Resection and retrieval  ?     were complete. Estimated blood loss was minimal. To prevent bleeding  ?     post-intervention, two hemostatic clips were successfully placed. There  ?     was no bleeding during, or at the end, of the procedure. ?     A 5 mm polyp was found in the proximal transverse colon. The polyp was  ?     sessile. The polyp was removed with a cold snare. Resection and  ?     retrieval were complete. Estimated blood loss was minimal. ?     A tattoo was seen in the descending colon. The tattoo site appeared  ?     normal. ?     Internal hemorrhoids were found during retroflexion. The hemorrhoids  ?     were Grade I (internal hemorrhoids that do not prolapse). ?     The exam was otherwise without abnormality on direct and retroflexion  ?     views. ?Impression:            - Two 2 to 5 mm polyps in the cecum, removed with a  ?  cold snare. Resected and retrieved. ?                       - One 13 mm polyp in the proximal transverse colon,  ?                       removed using injection-lift and a hot snare and  ?                       removed piecemeal using a hot snare. Resected and  ?                       retrieved. Clips were placed.  Tattooed. ?                       - One 10 mm polyp in the proximal transverse colon,  ?                       removed with a hot snare. Resected and retrieved.  ?                       Clips were placed. ?                       - One 5 mm polyp in the proximal transverse colon,  ?                       removed with a cold snare. Resected and retrieved. ?                       - A tattoo was seen in the descending colon. The  ?                       tattoo site appeared normal. ?                       - Internal hemorrhoids. ?                       - The examination was otherwise normal on direct and  ?                       retroflexion views. ?Recommendation:        - Discharge patient to home. ?                       - Resume previous diet. ?                       - Continue present medications. ?                       - Await pathology results. ?                       - Repeat colonoscopy in 6-12 months for surveillance  ?                       after piecemeal polypectomy. ?                       -  Return to referring physician as previously  ?                       scheduled. ?Procedure Code(s):     --- Professional --- ?                       [phone], Colonoscopy, flexible; with removal of  ?                       tumor(s), polyp(s), or other lesion(s) by snare  ?                       technique ?                       [id number], Colonoscopy, flexible; with directed submucosal  ?                       injection(s), any substance ?Diagnosis Code(s):     --- Professional --- ?                       K63.5, Polyp of colon ?                       Z86.010, Personal history of colonic polyps ?                       K64.0, First degree hemorrhoids ?CPT copyright 2019 [employer]. All rights reserved. ?The codes documented in this report are preliminary and upon coder review may  ?be revised to meet current compliance requirements. ?Andrey Farmer MD, MD ?11/04/2021 11:25:16 AM ?Number of Addenda: 0 ?Note  Initiated On: 11/04/2021 10:20 AM ?Scope Withdrawal Time: 0 hours 24 minutes 32 seconds  ?Total Procedure Duration: 0 hours 33 minutes 43 seconds  ?Estimated Blood Loss:  Estimated blood loss was minimal. ?

## 2021-11-04 NOTE — Transfer of Care (Signed)
Immediate Anesthesia Transfer of Care Note ? ?Patient: Nathan Merritt ? ?Procedure(s) Performed: COLONOSCOPY WITH PROPOFOL ? ?Patient Location: PACU ? ?Anesthesia Type:General ? ?Level of Consciousness: awake and sedated ? ?Airway & Oxygen Therapy: Patient Spontanous Breathing and Patient connected to nasal cannula oxygen ? ?Post-op Assessment: Report given to RN and Post -op Vital signs reviewed and stable ? ?Post vital signs: Reviewed and stable ? ?Last Vitals:  ?Vitals Value Taken Time  ?BP    ?Temp    ?Pulse    ?Resp    ?SpO2    ? ? ?Last Pain:  ?Vitals:  ? 11/04/21 0944  ?TempSrc: Temporal  ?PainSc: 0-No pain  ?   ? ?  ? ?Complications: No notable events documented. ?

## 2021-11-04 NOTE — Interval H&P Note (Signed)
History and Physical Interval Note: ? ?11/04/2021 ?10:28 AM ? ?Nathan Merritt  has presented today for surgery, with the diagnosis of H/O Colon Polyps.  The various methods of treatment have been discussed with the patient and family. After consideration of risks, benefits and other options for treatment, the patient has consented to  Procedure(s): ?COLONOSCOPY WITH PROPOFOL (N/A) as a surgical intervention.  The patient's history has been reviewed, patient examined, no change in status, stable for surgery.  I have reviewed the patient's chart and labs.  Questions were answered to the patient's satisfaction.   ? ? ?Nathan Merritt ? ?Ok to proceed with colonoscopy ?

## 2021-11-04 NOTE — H&P (Signed)
Outpatient short stay form Pre-procedure ?11/04/2021  ?Lesly Rubenstein, MD ? ?Primary Physician: Baxter Hire, MD ? ?Reason for visit:  Surveillance ? ?History of present illness:   ? ?76 y/o gentleman with history of hypertension whose last colonoscopy in 2017 had over 10 Ta's with some large polyps and one being TVA and one 15 mm TA removed piecemeal in the descending colon. No blood thinners. No family history of GI malignancies. History of cholecystectomy. ? ? ? ?Current Facility-Administered Medications:  ?  0.9 %  sodium chloride infusion, , Intravenous, Continuous, Maris Abascal, Hilton Cork, MD, Last Rate: 20 mL/hr at 11/04/21 1000, Continued from Pre-op at 11/04/21 1000 ? ?Medications Prior to Admission  ?Medication Sig Dispense Refill Last Dose  ? amLODipine (NORVASC) 10 MG tablet Take 10 mg by mouth daily.    11/04/2021 at 0530  ? dorzolamide-timolol (COSOPT) 22.3-6.8 MG/ML ophthalmic solution Place 1 drop into both eyes 2 (two) times daily.    11/03/2021  ? hydrochlorothiazide (HYDRODIURIL) 25 MG tablet Take 25 mg by mouth daily.   11/03/2021  ? prednisoLONE acetate (PRED FORTE) 1 % ophthalmic suspension Place 1 drop into the right eye daily.   11/03/2021  ? sildenafil (REVATIO) 20 MG tablet Take 60 mg by mouth daily as needed (for ED).    Past Month  ? ? ? ?Allergies  ?Allergen Reactions  ? Acetazolamide Other (See Comments)  ?  Eye drops, caused headache  ? ? ? ?Past Medical History:  ?Diagnosis Date  ? Arthritis   ? BPH (benign prostatic hyperplasia)   ? Glaucoma   ? Glaucoma   ? History of chicken pox   ? Hyperlipidemia   ? Hypertension   ? Neutropenia ([place])   ? Tubular adenoma of colon   ? ? ?Review of systems:  Otherwise negative.  ? ? ?Physical Exam ? ?Gen: Alert, oriented. Appears stated age.  ?HEENT: PERRLA. ?Lungs: No respiratory distress ?CV: RRR ?Abd: soft, benign, no masses ?Ext: No edema ? ? ? ?Planned procedures: Proceed with colonoscopy. The patient understands the nature of the planned  procedure, indications, risks, alternatives and potential complications including but not limited to bleeding, infection, perforation, damage to internal organs and possible oversedation/side effects from anesthesia. The patient agrees and gives consent to proceed.  ?Please refer to procedure notes for findings, recommendations and patient disposition/instructions.  ? ? ? ?Lesly Rubenstein, MD ?[facility] ? ? ? ?  ? ?

## 2021-11-04 NOTE — Anesthesia Procedure Notes (Signed)
Date/Time: 11/04/2021 10:33 AM ?Performed by: Vaughan Sine ?Pre-anesthesia Checklist: Patient identified, Emergency Drugs available, Suction available, Patient being monitored and Timeout performed ?Patient Re-evaluated:Patient Re-evaluated prior to induction ?Oxygen Delivery Method: Simple face mask ?Preoxygenation: Pre-oxygenation with 100% oxygen ?Induction Type: IV induction ? ? ? ? ?

## 2021-11-04 NOTE — Anesthesia Postprocedure Evaluation (Signed)
Anesthesia Post Note ? ?Patient: Nathan Merritt ? ?Procedure(s) Performed: COLONOSCOPY WITH PROPOFOL ? ?Patient location during evaluation: Endoscopy ?Anesthesia Type: General ?Level of consciousness: awake and alert ?Pain management: pain level controlled ?Vital Signs Assessment: post-procedure vital signs reviewed and stable ?Respiratory status: spontaneous breathing, nonlabored ventilation and respiratory function stable ?Cardiovascular status: blood pressure returned to baseline and stable ?Postop Assessment: no apparent nausea or vomiting ?Anesthetic complications: no ? ? ?No notable events documented. ? ? ?Last Vitals:  ?Vitals:  ? 11/04/21 1138 11/04/21 1148  ?BP: 104/66 115/61  ?Pulse:    ?Resp: 17 19  ?Temp: (!) 35.7 ?C   ?SpO2: 99% 98%  ?  ?Last Pain:  ?Vitals:  ? 11/04/21 1148  ?TempSrc:   ?PainSc: 0-No pain  ? ? ?  ?  ?  ?  ?  ?  ? ?[place] Ouch ? ? ? ? ?

## 2021-11-07 LAB — SURGICAL PATHOLOGY

## 2022-10-16 ENCOUNTER — Telehealth: Payer: Self-pay | Admitting: Urology

## 2022-10-16 NOTE — Telephone Encounter (Signed)
Patient stopped by office today to request that Larene Beach write a letter to excuse him from jury duty on March 11th. He states that he has to use the restroom every 2 1/2 hours, and does not think he would be able to do that if serving on jury duty. Please let patient know if you can write a letter for him. His phone number is [phone].

## 2022-10-17 NOTE — Telephone Encounter (Signed)
Nathan Merritt scheduled patient already.
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
## 2022-10-23 ENCOUNTER — Other Ambulatory Visit
Admission: RE | Admit: 2022-10-23 | Discharge: 2022-10-23 | Disposition: A | Discharge status: Home / Self Care | Payer: Medicare Other | Attending: Urology | Admitting: Urology

## 2022-10-23 ENCOUNTER — Ambulatory Visit: Payer: Medicare Other | Admitting: Urology

## 2022-10-23 ENCOUNTER — Encounter: Payer: Self-pay | Admitting: Urology

## 2022-10-23 ENCOUNTER — Other Ambulatory Visit: Payer: Self-pay | Admitting: Family Medicine

## 2022-10-23 VITALS — BP 151/87 | HR 77 | Ht 75.0 in | Wt 255.0 lb

## 2022-10-23 DIAGNOSIS — N529 Male erectile dysfunction, unspecified: Secondary | ICD-10-CM | POA: Diagnosis not present

## 2022-10-23 DIAGNOSIS — R972 Elevated prostate specific antigen [PSA]: Secondary | ICD-10-CM | POA: Diagnosis not present

## 2022-10-23 DIAGNOSIS — N401 Enlarged prostate with lower urinary tract symptoms: Secondary | ICD-10-CM

## 2022-10-23 DIAGNOSIS — N138 Other obstructive and reflux uropathy: Secondary | ICD-10-CM

## 2022-10-23 LAB — PSA: Prostatic Specific Antigen: 16.98 ng/mL — ABNORMAL HIGH (ref 0.00–4.00)

## 2022-10-23 LAB — BLADDER SCAN AMB NON-IMAGING: Scan Result: 93

## 2022-10-24 ENCOUNTER — Telehealth: Payer: Self-pay | Admitting: Family Medicine

## 2022-10-24 NOTE — Telephone Encounter (Signed)
-----   Message from Nori Riis, PA-C sent at 10/24/2022  8:11 AM EST ----- Please let Nathan Merritt know that his PSA remains stable at 16.98.  We should see him again in three months for I PSS and PVR.

## 2022-10-24 NOTE — Telephone Encounter (Signed)
LMOM for patient to return call.

## 2022-10-25 NOTE — Telephone Encounter (Signed)
Notified patient as instructed, patient pleased. Discussed follow-up appointment and made

## 2022-11-01 ENCOUNTER — Other Ambulatory Visit: Payer: Self-pay | Admitting: Nurse Practitioner

## 2022-11-01 DIAGNOSIS — R748 Abnormal levels of other serum enzymes: Secondary | ICD-10-CM

## 2022-11-01 DIAGNOSIS — R17 Unspecified jaundice: Secondary | ICD-10-CM

## 2022-11-01 DIAGNOSIS — K760 Fatty (change of) liver, not elsewhere classified: Secondary | ICD-10-CM

## 2022-11-09 ENCOUNTER — Ambulatory Visit
Admission: RE | Admit: 2022-11-09 | Discharge: 2022-11-09 | Disposition: A | Discharge status: Home / Self Care | Payer: Medicare Other | Source: Ambulatory Visit | Attending: Nurse Practitioner | Admitting: Nurse Practitioner

## 2022-11-09 DIAGNOSIS — K760 Fatty (change of) liver, not elsewhere classified: Secondary | ICD-10-CM

## 2022-11-09 DIAGNOSIS — R17 Unspecified jaundice: Secondary | ICD-10-CM

## 2022-11-09 DIAGNOSIS — R748 Abnormal levels of other serum enzymes: Secondary | ICD-10-CM

## 2022-11-14 ENCOUNTER — Ambulatory Visit
Admission: RE | Admit: 2022-11-14 | Discharge: 2022-11-14 | Disposition: A | Discharge status: Home / Self Care | Payer: Medicare Other | Source: Ambulatory Visit | Attending: Nurse Practitioner | Admitting: Nurse Practitioner

## 2022-11-14 DIAGNOSIS — R17 Unspecified jaundice: Secondary | ICD-10-CM | POA: Diagnosis present

## 2022-11-14 DIAGNOSIS — R748 Abnormal levels of other serum enzymes: Secondary | ICD-10-CM | POA: Insufficient documentation

## 2022-11-14 DIAGNOSIS — K760 Fatty (change of) liver, not elsewhere classified: Secondary | ICD-10-CM | POA: Insufficient documentation
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
## 2023-01-25 ENCOUNTER — Ambulatory Visit: Payer: Medicare Other | Admitting: Urology

## 2023-01-25 DIAGNOSIS — N138 Other obstructive and reflux uropathy: Secondary | ICD-10-CM

## 2023-01-25 DIAGNOSIS — N529 Male erectile dysfunction, unspecified: Secondary | ICD-10-CM

## 2023-01-25 DIAGNOSIS — R972 Elevated prostate specific antigen [PSA]: Secondary | ICD-10-CM

## 2023-02-06 ENCOUNTER — Ambulatory Visit: Payer: Medicare Other | Admitting: Anesthesiology

## 2023-02-06 ENCOUNTER — Encounter: Payer: Self-pay | Admitting: *Deleted

## 2023-02-06 ENCOUNTER — Ambulatory Visit
Admission: RE | Admit: 2023-02-06 | Discharge: 2023-02-06 | Disposition: A | Discharge status: Home / Self Care | Payer: Medicare Other | Source: Ambulatory Visit | Attending: Gastroenterology | Admitting: Gastroenterology

## 2023-02-06 ENCOUNTER — Encounter
Admission: RE | Disposition: A | Discharge status: Home / Self Care | Payer: Self-pay | Source: Ambulatory Visit | Attending: Gastroenterology

## 2023-02-06 DIAGNOSIS — I10 Essential (primary) hypertension: Secondary | ICD-10-CM | POA: Insufficient documentation

## 2023-02-06 DIAGNOSIS — Z09 Encounter for follow-up examination after completed treatment for conditions other than malignant neoplasm: Secondary | ICD-10-CM | POA: Diagnosis present

## 2023-02-06 DIAGNOSIS — Z79899 Other long term (current) drug therapy: Secondary | ICD-10-CM | POA: Diagnosis not present

## 2023-02-06 DIAGNOSIS — D123 Benign neoplasm of transverse colon: Secondary | ICD-10-CM | POA: Diagnosis not present

## 2023-02-06 DIAGNOSIS — Z9049 Acquired absence of other specified parts of digestive tract: Secondary | ICD-10-CM | POA: Diagnosis not present

## 2023-02-06 DIAGNOSIS — Z96651 Presence of right artificial knee joint: Secondary | ICD-10-CM | POA: Diagnosis not present

## 2023-02-06 DIAGNOSIS — H409 Unspecified glaucoma: Secondary | ICD-10-CM | POA: Insufficient documentation

## 2023-02-06 DIAGNOSIS — K529 Noninfective gastroenteritis and colitis, unspecified: Secondary | ICD-10-CM | POA: Insufficient documentation

## 2023-02-06 HISTORY — PX: POLYPECTOMY: SHX5525

## 2023-02-06 HISTORY — PX: COLONOSCOPY WITH PROPOFOL: SHX5780

## 2023-02-06 SURGERY — COLONOSCOPY WITH PROPOFOL
Anesthesia: General

## 2023-02-06 MED ORDER — LIDOCAINE HCL (PF) 2 % IJ SOLN
INTRAMUSCULAR | Status: DC | PRN
  Administered 2023-02-06: 40 mg via INTRADERMAL

## 2023-02-06 MED ORDER — SODIUM CHLORIDE 0.9 % IV SOLN
INTRAVENOUS | Status: DC
  Administered 2023-02-06: 1000 mL via INTRAVENOUS

## 2023-02-06 MED ORDER — PROPOFOL 10 MG/ML IV BOLUS
INTRAVENOUS | Status: DC | PRN
  Administered 2023-02-06: 20 mg via INTRAVENOUS
  Administered 2023-02-06: 30 mg via INTRAVENOUS
  Administered 2023-02-06 (×5): 20 mg via INTRAVENOUS
  Administered 2023-02-06 (×2): 30 mg via INTRAVENOUS
  Administered 2023-02-06: 20 mg via INTRAVENOUS
  Administered 2023-02-06: 70 mg via INTRAVENOUS

## 2023-02-06 NOTE — Anesthesia Postprocedure Evaluation (Signed)
Anesthesia Post Note  Patient: Nathan Merritt  Procedure(s) Performed: COLONOSCOPY WITH PROPOFOL  Patient location during evaluation: Endoscopy Anesthesia Type: General Level of consciousness: awake and alert Pain management: pain level controlled Vital Signs Assessment: post-procedure vital signs reviewed and stable Respiratory status: spontaneous breathing, nonlabored ventilation, respiratory function stable and patient connected to nasal cannula oxygen Cardiovascular status: blood pressure returned to baseline and stable Postop Assessment: no apparent nausea or vomiting Anesthetic complications: no   No notable events documented.   Last Vitals:  Vitals:   02/06/23 1151 02/06/23 1312  BP: (!) 155/94 104/69  Pulse: 81 72  Resp: 16   Temp: 36.4 C (!) 35.8 C  SpO2: 98% 98%    Last Pain:  Vitals:   02/06/23 1312  TempSrc: Temporal  PainSc: Asleep                 Corinda Gubler

## 2023-02-06 NOTE — Interval H&P Note (Signed)
History and Physical Interval Note:  02/06/2023 12:46 PM  Nathan Merritt  has presented today for surgery, with the diagnosis of hx ta polyps.  The various methods of treatment have been discussed with the patient and family. After consideration of risks, benefits and other options for treatment, the patient has consented to  Procedure(s): COLONOSCOPY WITH PROPOFOL (N/A) as a surgical intervention.  The patient's history has been reviewed, patient examined, no change in status, stable for surgery.  I have reviewed the patient's chart and labs.  Questions were answered to the patient's satisfaction.     [name]  Ok to proceed with colonoscopy

## 2023-02-06 NOTE — Transfer of Care (Signed)
Immediate Anesthesia Transfer of Care Note  Patient: Nathan Merritt  Procedure(s) Performed: COLONOSCOPY WITH PROPOFOL  Patient Location: PACU and Endoscopy Unit  Anesthesia Type:MAC  Level of Consciousness: drowsy  Airway & Oxygen Therapy: Patient Spontanous Breathing and Patient connected to nasal cannula oxygen  Post-op Assessment: Report given to RN and Post -op Vital signs reviewed and stable  Post vital signs: Reviewed and stable  Last Vitals:  Vitals Value Taken Time  BP    Temp 35.8 C 02/06/23 1312  Pulse 72 02/06/23 1312  Resp    SpO2 98 % 02/06/23 1312    Last Pain:  Vitals:   02/06/23 1312  TempSrc: Temporal  PainSc: Asleep         Complications: No notable events documented.

## 2023-02-06 NOTE — H&P (Signed)
Outpatient short stay form Pre-procedure 02/06/2023  [name], MD  Primary Physician: Gracelyn Nurse, MD  Reason for visit:  History of piecemeal polypectomy  History of present illness:    77 y/o gentleman with history of hypertension here for colonoscopy for piecemeal polypectomy last year. No blood thinners. No family history of GI malignancies. History of cholecystectomy.    Current Facility-Administered Medications:    0.9 %  sodium chloride infusion, , Intravenous, Continuous, [facility], Rossie Muskrat, MD, Last Rate: 20 mL/hr at 02/06/23 1243, Continued from Pre-op at 02/06/23 1243  Medications Prior to Admission  Medication Sig Dispense Refill Last Dose   amLODipine (NORVASC) 10 MG tablet Take 10 mg by mouth daily.    02/06/2023 at 0700   dorzolamide-timolol (COSOPT) 22.3-6.8 MG/ML ophthalmic solution Place 1 drop into both eyes 2 (two) times daily.    02/05/2023   hydrochlorothiazide (HYDRODIURIL) 25 MG tablet Take 25 mg by mouth daily.   02/05/2023   prednisoLONE acetate (PRED FORTE) 1 % ophthalmic suspension Place 1 drop into the right eye daily.   02/05/2023   sildenafil (REVATIO) 20 MG tablet Take 60 mg by mouth daily as needed (for ED).     at prn     Allergies  Allergen Reactions   Acetazolamide Other (See Comments)    Eye drops, caused headache     Past Medical History:  Diagnosis Date   Arthritis    BPH (benign prostatic hyperplasia)    Glaucoma    Glaucoma    History of chicken pox    Hyperlipidemia    Hypertension    Neutropenia (HCC)    Tubular adenoma of colon     Review of systems:  Otherwise negative.    Physical Exam  Gen: Alert, oriented. Appears stated age.  HEENT: PERRLA. Lungs: No respiratory distress CV: RRR Abd: soft, benign, no masses Ext: No edema    Planned procedures: Proceed with colonoscopy. The patient understands the nature of the planned procedure, indications, risks, alternatives and potential complications  including but not limited to bleeding, infection, perforation, damage to internal organs and possible oversedation/side effects from anesthesia. The patient agrees and gives consent to proceed.  Please refer to procedure notes for findings, recommendations and patient disposition/instructions.     [name], MD [facility]
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
## 2023-02-06 NOTE — Op Note (Signed)
[facility] Gastroenterology Patient Name: Nathan Merritt Procedure Date: 02/06/2023 12:43 PM MRN: [id number] Account #: [id number] Date of Birth: November 30, 1945 Admit Type: Outpatient Age: 77 Room: [facility] ENDO ROOM 1 Gender: Male Note Status: Finalized Instrument Name: Nelda Marseille [id number] Procedure:             Colonoscopy Indications:           Surveillance: Piecemeal removal of large sessile                         adenoma last colonoscopy (< 3 yrs) Providers:             Eather Colas MD, MD Medicines:             Monitored Anesthesia Care Complications:         No immediate complications. Estimated blood loss:                         Minimal. Procedure:             Pre-Anesthesia Assessment:                        - Prior to the procedure, a History and Physical was                         performed, and patient medications and allergies were                         reviewed. The patient is competent. The risks and                         benefits of the procedure and the sedation options and                         risks were discussed with the patient. All questions                         were answered and informed consent was obtained.                         Patient identification and proposed procedure were                         verified by the physician, the nurse, the                         anesthesiologist, the anesthetist and the technician                         in the endoscopy suite. Mental Status Examination:                         alert and oriented. Airway Examination: normal                         oropharyngeal airway and neck mobility. Respiratory                         Examination: clear to auscultation. CV Examination:  normal. Prophylactic Antibiotics: The patient does not                         require prophylactic antibiotics. Prior                         Anticoagulants: The patient has taken no anticoagulant                          or antiplatelet agents. ASA Grade Assessment: II - A                         patient with mild systemic disease. After reviewing                         the risks and benefits, the patient was deemed in                         satisfactory condition to undergo the procedure. The                         anesthesia plan was to use monitored anesthesia care                         (MAC). Immediately prior to administration of                         medications, the patient was re-assessed for adequacy                         to receive sedatives. The heart rate, respiratory                         rate, oxygen saturations, blood pressure, adequacy of                         pulmonary ventilation, and response to care were                         monitored throughout the procedure. The physical                         status of the patient was re-assessed after the                         procedure.                        After obtaining informed consent, the colonoscope was                         passed under direct vision. Throughout the procedure,                         the patient's blood pressure, pulse, and oxygen                         saturations were monitored continuously. The  Colonoscope was introduced through the anus and                         advanced to the the cecum, identified by appendiceal                         orifice and ileocecal valve. The colonoscopy was                         somewhat difficult due to a redundant colon. The                         patient tolerated the procedure well. The quality of                         the bowel preparation was good. The ileocecal valve,                         appendiceal orifice, and rectum were photographed. Findings:      The perianal and digital rectal examinations were normal.      A 3 mm polyp was found in the proximal transverse colon. The polyp was       sessile. The  polyp was removed with a cold snare. Resection and       retrieval were complete. Suspect this polyp was from previous piecemeal       polypectomy given appearance. Did not observe tattoo that was placed       even after severe passes.      A 4 mm polyp was found in the distal transverse colon. The polyp was       sessile. The polyp was removed with a cold snare. Resection and       retrieval were complete. Estimated blood loss was minimal.      A tattoo was seen in the descending colon. The tattoo site appeared       normal.      The exam was otherwise without abnormality on direct and retroflexion       views. Impression:            - One 3 mm polyp in the proximal transverse colon,                         removed with a cold snare. Resected and retrieved.                        - One 4 mm polyp in the distal transverse colon,                         removed with a cold snare. Resected and retrieved.                        - A tattoo was seen in the descending colon. The                         tattoo site appeared normal.                        - The examination was otherwise normal on direct and  retroflexion views. Recommendation:        - Discharge patient to home.                        - Resume previous diet.                        - Continue present medications.                        - Await pathology results.                        - Repeat colonoscopy in 5 years for surveillance if                         benefits outweigh risks.                        - Return to referring physician as previously                         scheduled. Procedure Code(s):     --- Professional ---                        [phone], Colonoscopy, flexible; with removal of                         tumor(s), polyp(s), or other lesion(s) by snare                         technique Diagnosis Code(s):     --- Professional ---                        Z86.010, Personal history of colonic  polyps                        D12.3, Benign neoplasm of transverse colon (hepatic                         flexure or splenic flexure) CPT copyright 2022 American [employer]. All rights reserved. The codes documented in this report are preliminary and upon coder review may  be revised to meet current compliance requirements. Eather Colas MD, MD 02/06/2023 1:15:27 PM Number of Addenda: 0 Note Initiated On: 02/06/2023 12:43 PM Scope Withdrawal Time: 0 hours 10 minutes 43 seconds  Total Procedure Duration: 0 hours 19 minutes 4 seconds  Estimated Blood Loss:  Estimated blood loss was minimal.      [facility]

## 2023-02-07 ENCOUNTER — Encounter: Payer: Self-pay | Admitting: Gastroenterology

## 2023-02-08 ENCOUNTER — Encounter: Payer: Self-pay | Admitting: Gastroenterology

## 2023-04-04 ENCOUNTER — Ambulatory Visit: Payer: Medicare Other | Admitting: Urology

## 2023-04-04 VITALS — BP 146/81 | HR 93 | Ht 75.0 in | Wt 236.0 lb

## 2023-04-04 DIAGNOSIS — N401 Enlarged prostate with lower urinary tract symptoms: Secondary | ICD-10-CM | POA: Diagnosis not present

## 2023-04-04 DIAGNOSIS — R972 Elevated prostate specific antigen [PSA]: Secondary | ICD-10-CM | POA: Diagnosis not present

## 2023-04-04 DIAGNOSIS — R310 Gross hematuria: Secondary | ICD-10-CM | POA: Diagnosis not present

## 2023-04-04 DIAGNOSIS — N138 Other obstructive and reflux uropathy: Secondary | ICD-10-CM

## 2023-04-04 LAB — BLADDER SCAN AMB NON-IMAGING: Scan Result: 1

## 2023-04-04 MED ORDER — TAMSULOSIN HCL 0.4 MG PO CAPS: 0.4000 mg | ORAL_CAPSULE | Freq: Every day | ORAL | 3 refills | Status: DC

## 2023-04-04 MED ORDER — FINASTERIDE 5 MG PO TABS
5.0000 mg | ORAL_TABLET | Freq: Every day | ORAL | 0 refills | Status: DC
Start: 2023-04-04 — End: 2023-06-28
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
## 2023-04-14 ENCOUNTER — Encounter: Payer: Self-pay | Admitting: Urology

## 2023-04-14 ENCOUNTER — Telehealth: Payer: Self-pay | Admitting: Urology

## 2023-04-14 DIAGNOSIS — R310 Gross hematuria: Secondary | ICD-10-CM

## 2023-04-14 NOTE — Telephone Encounter (Signed)
Please let Nathan Merritt know that his urine culture was negative, so we need to go forward with the CT scan and cystoscopy with Dr. Richardo Hanks.  I have placed orders for the CT urogram and the CT schedulers should contact him and get that scheduled.  He will need to have an appointment with Dr. Richardo Hanks to go over the results and have a cystoscopy.

## 2023-04-16 NOTE — Telephone Encounter (Signed)
.  left message to have patient return my call.  

## 2023-04-19 NOTE — Telephone Encounter (Signed)
Spoke with patient and advised results, appt scheduled with Stoioff, per pt that's who he has seen in the past

## 2023-05-04 ENCOUNTER — Ambulatory Visit
Admission: RE | Admit: 2023-05-04 | Discharge: 2023-05-04 | Disposition: A | Discharge status: Home / Self Care | Payer: Medicare Other | Source: Ambulatory Visit | Attending: Urology

## 2023-05-04 DIAGNOSIS — R310 Gross hematuria: Secondary | ICD-10-CM | POA: Diagnosis present

## 2023-05-04 MED ORDER — IOHEXOL 300 MG/ML  SOLN
100.0000 mL | Freq: Once | INTRAMUSCULAR | Status: AC | PRN
  Administered 2023-05-04: 100 mL via INTRAVENOUS

## 2023-05-10 ENCOUNTER — Encounter: Payer: Self-pay | Admitting: Urology

## 2023-05-10 ENCOUNTER — Ambulatory Visit: Payer: Medicare Other | Admitting: Urology

## 2023-05-10 VITALS — BP 142/81 | HR 70 | Ht 75.0 in | Wt 221.0 lb

## 2023-05-10 DIAGNOSIS — N400 Enlarged prostate without lower urinary tract symptoms: Secondary | ICD-10-CM | POA: Diagnosis not present

## 2023-05-10 DIAGNOSIS — R310 Gross hematuria: Secondary | ICD-10-CM | POA: Diagnosis not present

## 2023-05-10 LAB — URINALYSIS, COMPLETE
Bilirubin, UA: NEGATIVE
Ketones, UA: NEGATIVE
Leukocytes,UA: NEGATIVE
Nitrite, UA: NEGATIVE
RBC, UA: NEGATIVE
Specific Gravity, UA: 1.025 (ref 1.005–1.030)
Urobilinogen, Ur: 0.2 mg/dL (ref 0.2–1.0)
pH, UA: 5.5 (ref 5.0–7.5)

## 2023-05-10 LAB — MICROSCOPIC EXAMINATION: Bacteria, UA: NONE SEEN

## 2023-05-10 NOTE — Progress Notes (Signed)
   05/10/23  CC:  Chief Complaint  Patient presents with   Cysto    HPI: Refer to [facility]'s note 04/04/2023.  CTU personally reviewed and interpreted.  Bilateral simple renal cysts.  No hydronephrosis.  No ureteral abnormalities.  Marked prostate enlargement with volume calculated at 495 cc  Blood pressure (!) 142/81, pulse 70, height 6\' 3"  (1.905 m), weight 221 lb (100.2 kg). NED. A&Ox3.   No respiratory distress   Abd soft, NT, ND Normal phallus with bilateral descended testicles  Cystoscopy Procedure Note  Patient identification was confirmed, informed consent was obtained, and patient was prepped using Betadine solution.  Lidocaine jelly was administered per urethral meatus.     Pre-Procedure: - Inspection reveals a normal caliber urethral meatus.  Procedure: The flexible cystoscope was introduced without difficulty - No urethral strictures/lesions are present. - Coapting lateral lobes prostate with hypervascularity/friability - Elevated bladder neck with median lobe - Bilateral ureteral orifices identified - Bladder mucosa  reveals no ulcers, tumors, or lesions - No bladder stones - No trabeculation -Visualization suboptimal secondary to backbleeding from prostate  Retroflexion shows unable to assess intravesical median lobe due to backbleeding from prostate however intravesical median lobe seen on CT   Post-Procedure: - Patient tolerated the procedure well  Assessment/ Plan: Gross hematuria secondary to BPH Urine cytology sent Consider PAE downsizing followed by HoLEP Prior appointment had been scheduled by our PA with Dr. Richardo Hanks to discuss HoLEP    Riki Altes, MD

## 2023-05-13 ENCOUNTER — Encounter: Payer: Self-pay | Admitting: Urology

## 2023-05-17 ENCOUNTER — Encounter: Payer: Self-pay | Admitting: Urology

## 2023-05-17 ENCOUNTER — Other Ambulatory Visit: Payer: Medicare Other | Admitting: Urology

## 2023-05-17 ENCOUNTER — Ambulatory Visit: Payer: Medicare Other | Admitting: Urology

## 2023-05-17 VITALS — BP 164/81 | HR 82 | Ht 75.0 in

## 2023-05-17 DIAGNOSIS — N401 Enlarged prostate with lower urinary tract symptoms: Secondary | ICD-10-CM | POA: Diagnosis not present

## 2023-05-17 DIAGNOSIS — N529 Male erectile dysfunction, unspecified: Secondary | ICD-10-CM

## 2023-05-17 DIAGNOSIS — R310 Gross hematuria: Secondary | ICD-10-CM

## 2023-05-17 DIAGNOSIS — N138 Other obstructive and reflux uropathy: Secondary | ICD-10-CM

## 2023-05-17 LAB — BLADDER SCAN AMB NON-IMAGING

## 2023-05-17 NOTE — Patient Instructions (Addendum)
You have a very enlarged prostate that is contributing to your urinary symptoms as well as the intermittent blood in the urine.  Treatment options for this include a medication called finasteride that can shrink the prostate, PAE(prostate artery embolization) which is a minimally invasive procedure performed by radiology to block the blood vessels to the prostate and cause shrinkage to improve urinary symptoms and resolved bleeding, and the most invasive treatment would be a HOLEP(holmium laser enucleation of the prostate) which is a surgery to remove prostate tissue and blockage.  We placed a referral to radiology to discuss PAE procedure, and I will see you back in 3 to 4 months to check your urinary symptoms.  Nocturia refers to the need to wake up during the night to urinate, which can disrupt your sleep and impact your overall well-being. Fortunately, there are several strategies you can employ to help prevent or manage nocturia. It's important to consult with your healthcare provider before making any significant changes to your routine. Here are some helpful strategies to consider:  Limit Fluid Intake Before Bed: Avoid drinking large amounts of fluids in the evening, especially within a few hours of bedtime. Consume most of your daily fluid intake earlier in the day to reduce the need to urinate at night.  Monitor Your Diet: Limit your intake of caffeine and alcohol, as these substances can increase urine production and irritate the bladder.  Avoid diet, "zero calorie," and artificially sweetened drinks, especially sodas, in the afternoon or evening. Be mindful of consuming foods and drinks with high water content before bedtime, such as watermelon and herbal teas.  Time Your Medications: If you're taking medications that contribute to increased urination, consult your healthcare provider about adjusting the timing of these medications to minimize their impact during the night.  Practice  Double Voiding: Before going to bed, make an effort to empty your bladder twice within a short period. This can help reduce the amount of urine left in your bladder before sleep.  Bladder Training: Gradually increase the time between bathroom visits during the day to train your bladder to hold larger volumes of urine. Over time, this can help reduce the frequency of nighttime awakenings to urinate.  Elevate Your Legs During the Day: Elevating your legs during the day can help minimize fluid retention in your lower extremities, which might reduce nighttime urination.  Pelvic Floor Exercises: Strengthening your pelvic floor muscles through Kegel exercises can help improve bladder control and potentially reduce the urge to urinate at night.  Create a Relaxing Bedtime Routine: Stress and anxiety can exacerbate nocturia. Engage in calming activities before bed, such as reading, listening to soothing music, or practicing relaxation techniques.  Stay Active: Engage in regular physical activity, but avoid intense exercise close to bedtime, as this can increase your body's demand for fluids.  Maintain a Healthy Weight: Excess weight can compress the bladder and contribute to bladder and urinary issues. Aim to achieve and maintain a healthy weight through a balanced diet and regular exercise.  Remember that every individual is unique, and the effectiveness of these strategies may vary. It's important to work with your healthcare provider to develop a plan that suits your specific needs and addresses any underlying causes of nocturia.

## 2023-05-17 NOTE — Progress Notes (Signed)
05/17/2023 9:28 AM   Nathan Merritt 1945-08-31 [id number]  Reason for visit: BPH/LUTS, elevated PSA, ED  HPI: 77 year old male who has been followed by Dr. Lonna Cobb and Michiel Cowboy, PA for the above issues.  He is referred to discuss HOLEP.  He is here with his son today who helps provide some of the history.  I follow his son for elevated PSA as well.  He has significant BPH and recurrent intermittent gross hematuria felt to be secondary to BPH, recent hematuria workup with CT urogram showed a 350 g prostate, and cystoscopy showed normal bladder aside from massive BPH and backbleeding from the prostate.  I personally viewed and interpreted the CT imaging and agree with prostate sizing of 350g.  In terms of his urinary symptoms primary issue is urinary frequency every 2 hours during the day and nocturia 5-6 times overnight.  PVR is normal, including 4ml today.  Flomax and finasteride are on his medication list, but he denies that he has been taking finasteride.  PSA is elevated as expected, but stable, with most recent value of 17 from March 2024 with reassuring PSA density of 0.05.  We reviewed treatment options at length for his extensive BPH including shrinkage of the prostate with finasteride, prostatic artery embolization, or HOLEP.  He has not been taking the finasteride.  We discussed the risks and benefits of HoLEP at length.  The procedure requires general anesthesia and takes 1 to 2 hours, and a holmium laser is used to enucleate the prostate and push this tissue into the bladder.  A morcellator is then used to remove this tissue, which is sent for pathology.  The vast majority(>95%) of patients are able to discharge the same day with a catheter in place for 2 to 3 days, and will follow-up in clinic for a voiding trial.  We specifically discussed the risks of bleeding, infection, retrograde ejaculation, temporary urgency and urge incontinence, very low risk of long-term incontinence,  urethral stricture/bladder neck contracture, pathologic evaluation of prostate tissue and possible detection of prostate cancer or other malignancy, and possible need for additional procedures.  He is hesitant to consider surgery with HOLEP at this time, but is interested in meeting with radiology to discuss prostatic artery embolization.  We will coordinate this, we discussed that he may still require HOLEP at some point in the future pending improvement of his symptoms after PAE.  In terms of his ED, he has failed trial of max dose Cialis and Viagra.  He is not interested in trying penile injections.  He previously met with a provider to discuss penile prosthesis but ultimately deferred as this was not covered by insurance.  I think we need to have realistic options regarding his ED at this point with his age and other medical issues.  Finally, he drinks primarily tea and soda during the day.  We discussed behavioral strategies of avoiding bladder irritants and minimizing fluids prior to bedtime.  Could also consider sleep apnea evaluation in the future, as his primary bother is nocturia.  Referral placed to interventional radiology to consider PAE Behavioral strategies discussed regarding urinary symptoms and avoiding bladder irritants, may benefit from sleep apnea evaluation RTC with me 3 to 4 months to evaluate symptoms post PAE   I spent 50 total minutes on the day of the encounter including pre-visit review of the medical record, face-to-face time with the patient, and post visit ordering of labs/imaging/tests.   Sondra Come, MD  [facility]  Urology [address] [place], [place] [address] [phone]

## 2023-05-22 ENCOUNTER — Other Ambulatory Visit: Payer: Self-pay | Admitting: Urology

## 2023-05-22 DIAGNOSIS — N2889 Other specified disorders of kidney and ureter: Secondary | ICD-10-CM

## 2023-05-22 DIAGNOSIS — N401 Enlarged prostate with lower urinary tract symptoms: Secondary | ICD-10-CM

## 2023-05-22 HISTORY — DX: Other specified disorders of kidney and ureter: N28.89

## 2023-05-23 ENCOUNTER — Ambulatory Visit
Admission: RE | Admit: 2023-05-23 | Discharge: 2023-05-23 | Disposition: A | Discharge status: Home / Self Care | Payer: Medicare Other | Source: Ambulatory Visit | Attending: Urology | Admitting: Urology

## 2023-05-23 DIAGNOSIS — N401 Enlarged prostate with lower urinary tract symptoms: Secondary | ICD-10-CM

## 2023-05-23 HISTORY — PX: IR RADIOLOGIST EVAL & MGMT: IMG5224
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
## 2023-05-28 ENCOUNTER — Other Ambulatory Visit: Payer: Self-pay | Admitting: Interventional Radiology

## 2023-05-28 DIAGNOSIS — N2889 Other specified disorders of kidney and ureter: Secondary | ICD-10-CM

## 2023-05-28 DIAGNOSIS — N401 Enlarged prostate with lower urinary tract symptoms: Secondary | ICD-10-CM

## 2023-05-31 ENCOUNTER — Other Ambulatory Visit: Payer: Medicare Other | Admitting: Urology

## 2023-06-19 ENCOUNTER — Other Ambulatory Visit (HOSPITAL_COMMUNITY): Payer: Self-pay | Admitting: Student

## 2023-06-19 DIAGNOSIS — N401 Enlarged prostate with lower urinary tract symptoms: Secondary | ICD-10-CM

## 2023-06-19 MED ORDER — PHENAZOPYRIDINE HCL 100 MG PO TABS: 100.0000 mg | ORAL_TABLET | Freq: Three times a day (TID) | ORAL | 0 refills | Status: AC | PRN

## 2023-06-19 MED ORDER — CIPROFLOXACIN HCL 500 MG PO TABS: 500.0000 mg | ORAL_TABLET | Freq: Two times a day (BID) | ORAL | 0 refills | Status: AC

## 2023-06-19 MED ORDER — SOLIFENACIN SUCCINATE 5 MG PO TABS: 5.0000 mg | ORAL_TABLET | Freq: Every day | ORAL | 0 refills | Status: DC
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
## 2023-06-20 NOTE — Progress Notes (Signed)
Patient for IR PAE on Thurs 06/21/2023, I called and spoke with the patient on the phone and gave pre-procedure instructions. Pt was made aware to be here at 9a, NPO after MN prior to procedure as well as driver post procedure/recovery/discharge. Pt stated understanding.  Called   06/20/2023

## 2023-06-21 ENCOUNTER — Ambulatory Visit
Admission: RE | Admit: 2023-06-21 | Discharge: 2023-06-21 | Disposition: A | Discharge status: Home / Self Care | Payer: Medicare Other | Source: Ambulatory Visit | Attending: Interventional Radiology | Admitting: Interventional Radiology

## 2023-06-21 ENCOUNTER — Other Ambulatory Visit: Payer: Self-pay

## 2023-06-21 DIAGNOSIS — N401 Enlarged prostate with lower urinary tract symptoms: Secondary | ICD-10-CM

## 2023-06-21 DIAGNOSIS — N2889 Other specified disorders of kidney and ureter: Secondary | ICD-10-CM | POA: Insufficient documentation

## 2023-06-21 HISTORY — PX: IR EMBO TUMOR ORGAN ISCHEMIA INFARCT INC GUIDE ROADMAPPING: IMG5449

## 2023-06-21 HISTORY — PX: IR EMBO ARTERIAL NOT HEMORR HEMANG INC GUIDE ROADMAPPING: IMG5448

## 2023-06-21 LAB — CBC
HCT: 44.5 % (ref 39.0–52.0)
Hemoglobin: 15.8 g/dL (ref 13.0–17.0)
MCH: 31 pg (ref 26.0–34.0)
MCHC: 35.5 g/dL (ref 30.0–36.0)
MCV: 87.4 fL (ref 80.0–100.0)
Platelets: 224 10*3/uL (ref 150–400)
RBC: 5.09 MIL/uL (ref 4.22–5.81)
RDW: 12.8 % (ref 11.5–15.5)
WBC: 4.9 10*3/uL (ref 4.0–10.5)
nRBC: 0 % (ref 0.0–0.2)

## 2023-06-21 LAB — PROTIME-INR
INR: 1 (ref 0.8–1.2)
Prothrombin Time: 13.6 s (ref 11.4–15.2)

## 2023-06-21 LAB — BASIC METABOLIC PANEL
Anion gap: 8 (ref 5–15)
BUN: 17 mg/dL (ref 8–23)
CO2: 27 mmol/L (ref 22–32)
Calcium: 9 mg/dL (ref 8.9–10.3)
Chloride: 103 mmol/L (ref 98–111)
Creatinine, Ser: 0.82 mg/dL (ref 0.61–1.24)
GFR, Estimated: >60 mL/min (ref >60)
Glucose, Bld: 111 mg/dL — ABNORMAL HIGH (ref 70–99)
Potassium: 3.6 mmol/L (ref 3.5–5.1)
Sodium: 138 mmol/L (ref 135–145)

## 2023-06-21 MED ORDER — FENTANYL CITRATE (PF) 100 MCG/2ML IJ SOLN
INTRAMUSCULAR | Status: AC | PRN
  Administered 2023-06-21: 25 ug via INTRAVENOUS
  Administered 2023-06-21: 50 ug via INTRAVENOUS
  Administered 2023-06-21 (×2): 25 ug via INTRAVENOUS
  Administered 2023-06-21: 50 ug via INTRAVENOUS
  Administered 2023-06-21: 25 ug via INTRAVENOUS

## 2023-06-21 MED ORDER — CIPROFLOXACIN IN D5W 400 MG/200ML IV SOLN
INTRAVENOUS | Status: AC
  Filled 2023-06-21: qty 200

## 2023-06-21 MED ORDER — LIDOCAINE HCL 1 % IJ SOLN
INTRAMUSCULAR | Status: AC
  Filled 2023-06-21: qty 20

## 2023-06-21 MED ORDER — IOHEXOL 300 MG/ML  SOLN
100.0000 mL | Freq: Once | INTRAMUSCULAR | Status: AC | PRN
  Administered 2023-06-21: 100 mL via INTRA_ARTERIAL

## 2023-06-21 MED ORDER — CIPROFLOXACIN IN D5W 400 MG/200ML IV SOLN
400.0000 mg | INTRAVENOUS | Status: AC
  Administered 2023-06-21: 400 mg via INTRAVENOUS

## 2023-06-21 MED ORDER — MIDAZOLAM HCL 2 MG/2ML IJ SOLN
INTRAMUSCULAR | Status: AC | PRN
  Administered 2023-06-21 (×2): .5 mg via INTRAVENOUS
  Administered 2023-06-21: 1 mg via INTRAVENOUS
  Administered 2023-06-21: .5 mg via INTRAVENOUS
  Administered 2023-06-21: 1 mg via INTRAVENOUS
  Administered 2023-06-21: .5 mg via INTRAVENOUS

## 2023-06-21 MED ORDER — MIDAZOLAM HCL 2 MG/2ML IJ SOLN
INTRAMUSCULAR | Status: AC
  Filled 2023-06-21: qty 4

## 2023-06-21 MED ORDER — LIDOCAINE HCL 1 % IJ SOLN
2.0000 mL | Freq: Once | INTRAMUSCULAR | Status: AC
  Administered 2023-06-21: 2 mL via INTRADERMAL

## 2023-06-21 MED ORDER — SODIUM CHLORIDE 0.9 % IV SOLN: INTRAVENOUS | Status: DC

## 2023-06-21 MED ORDER — FENTANYL CITRATE (PF) 100 MCG/2ML IJ SOLN
INTRAMUSCULAR | Status: AC
  Filled 2023-06-21: qty 2

## 2023-06-21 MED ORDER — PREDNISONE 10 MG PO TABS
20.0000 mg | ORAL_TABLET | Freq: Once | ORAL | Status: AC
  Administered 2023-06-21: 20 mg via ORAL
  Filled 2023-06-21: qty 2

## 2023-06-21 MED ORDER — MIDAZOLAM HCL 2 MG/2ML IJ SOLN
INTRAMUSCULAR | Status: AC
  Filled 2023-06-21: qty 2

## 2023-06-21 MED ORDER — LIDOCAINE HCL (PF) 1 % IJ SOLN: 2.0000 mL | Freq: Once | INTRAMUSCULAR | Status: DC

## 2023-06-21 NOTE — Procedures (Signed)
Interventional Radiology Procedure Note  Procedure: Prostate artery embolization   Findings: Please refer to procedural dictation for full description. Right CFA access, 6 Fr Angioseal closure.  Complications: None immediate  Estimated Blood Loss: < 5ml  Recommendations: Remove foley. 2 hours bedrest (first hour flat, second hour head of bed up to 30 degrees). IR will arrange 1 month outpatient follow up. -phenazopyridine 100 mg TID x 7 days  -solifenacin 5 mg QD x 7 days  -ciprofloxacin 500 mg BID x 7 days  -ibuprofen x 7 days    Marliss Coots, MD

## 2023-06-22 ENCOUNTER — Other Ambulatory Visit: Payer: Self-pay | Admitting: Interventional Radiology

## 2023-06-22 DIAGNOSIS — N401 Enlarged prostate with lower urinary tract symptoms: Secondary | ICD-10-CM

## 2023-06-25 ENCOUNTER — Telehealth (HOSPITAL_COMMUNITY): Payer: Self-pay | Admitting: Student

## 2023-06-25 MED ORDER — METHYLPREDNISOLONE 4 MG PO TBPK: ORAL_TABLET | ORAL | 0 refills | Status: DC

## 2023-06-25 NOTE — Telephone Encounter (Signed)
Patient s/p PAE 06/21/23 with Dr. Elby Showers. Patient called IR today with complaints of straining to urinate, mild irritation while urinating and the sensation of incomplete bladder emptying. Patient also has a decreased appetite with some mild weakness. Patient denies other concerning symptoms such as abdominal pain, nausea/vomiting, diarrhea, fever, chills, etc.   Case discussed with Dr. Elby Showers. Patient likely has bladder wall irritation which is not uncommon with PAE. Medrol-Dose Pack, 4 mg tablets e-prescribed to the patient's CVC in [place]. Patient has taken a steroid taper/dose pack before. Mr. Elizondo knows I will call him in a few days to check in on him and he also has the number to the IR APP office at [facility] to call if he needs to speak with me sooner.   Alwyn Ren, [place] [phone] 06/25/2023, 10:39 AM

## 2023-06-28 ENCOUNTER — Other Ambulatory Visit: Payer: Self-pay | Admitting: Interventional Radiology

## 2023-06-28 ENCOUNTER — Encounter
Admission: RE | Admit: 2023-06-28 | Discharge: 2023-06-28 | Disposition: A | Discharge status: Home / Self Care | Payer: Medicare Other | Source: Ambulatory Visit | Attending: Interventional Radiology | Admitting: Interventional Radiology

## 2023-06-28 ENCOUNTER — Encounter: Payer: Self-pay | Admitting: Urgent Care

## 2023-06-28 ENCOUNTER — Encounter: Payer: Self-pay | Admitting: Interventional Radiology

## 2023-06-28 ENCOUNTER — Other Ambulatory Visit: Payer: Self-pay

## 2023-06-28 VITALS — Ht 75.0 in | Wt 240.0 lb

## 2023-06-28 DIAGNOSIS — Z01812 Encounter for preprocedural laboratory examination: Secondary | ICD-10-CM

## 2023-06-28 DIAGNOSIS — Z01810 Encounter for preprocedural cardiovascular examination: Secondary | ICD-10-CM

## 2023-06-28 DIAGNOSIS — I10 Essential (primary) hypertension: Secondary | ICD-10-CM

## 2023-06-28 DIAGNOSIS — N2889 Other specified disorders of kidney and ureter: Secondary | ICD-10-CM

## 2023-06-28 HISTORY — DX: Osteoarthritis of knee, unspecified: M17.9

## 2023-06-28 NOTE — Patient Instructions (Addendum)
Your procedure is scheduled on: Tuesday, November 12 Report to the Heart and Vascular Center at [facility] at 7:30 am.  Follow instructions given to you by Dr. Elby Showers.  REMEMBER: Instructions that are not followed completely may result in serious medical risk, up to and including death; or upon the discretion of your surgeon and anesthesiologist your surgery may need to be rescheduled.  Do not eat or drink after midnight the night before surgery.  No gum chewing or hard candies.  One week prior to surgery: starting today, November 7 Stop Anti-inflammatories (NSAIDS) such as Advil, Aleve, Ibuprofen, Motrin, Naproxen, Naprosyn and Aspirin based products such as Excedrin, Goody's Powder, BC Powder. Stop ANY OVER THE COUNTER supplements until after surgery.  You may however, continue to take Tylenol if needed for pain up until the day of surgery.  Sildenafil - do not take any at least 3 days before surgery.   Continue taking all of your other prescription medications up until the day of surgery.  ON THE DAY OF SURGERY ONLY TAKE THESE MEDICATIONS WITH SIPS OF WATER:  Amlodipine Dorzolamide-timolol eye drops Prednisolone eye drops  No Alcohol for 24 hours before or after surgery.  No Smoking including e-cigarettes for 24 hours before surgery.  No chewable tobacco products for at least 6 hours before surgery.  No nicotine patches on the day of surgery.  Do not use any "recreational" drugs for at least a week (preferably 2 weeks) before your surgery.  Please be advised that the combination of cocaine and anesthesia may have negative outcomes, up to and including death. If you test positive for cocaine, your surgery will be cancelled.  On the morning of surgery brush your teeth with toothpaste and water, you may rinse your mouth with mouthwash if you wish. Do not swallow any toothpaste or mouthwash.  Do not wear jewelry.  For welded (permanent) jewelry: bracelets, anklets, waist bands,  etc.  Please have this removed prior to surgery.  If it is not removed, there is a chance that hospital personnel will need to cut it off on the day of surgery.  Do not wear lotions, powders, or perfumes.   Do not shave body hair from the neck down 48 hours before surgery.  Contact lenses, hearing aids and dentures may not be worn into surgery.  Do not bring valuables to the hospital. [facility] is not responsible for any missing/lost belongings or valuables.   Notify your doctor if there is any change in your medical condition (cold, fever, infection).  Wear comfortable clothing (specific to your surgery type) to the hospital.  If you are being discharged the day of surgery, you will not be allowed to drive home. You will need a responsible individual to drive you home and stay with you for 24 hours after surgery.   If you are taking public transportation, you will need to have a responsible individual with you.  Please call the Pre-admissions Testing Dept. at [phone] if you have any questions about these instructions.  Surgery Visitation Policy:  Patients having surgery or a procedure may have two visitors.  Children under the age of 19 must have an adult with them who is not the patient.

## 2023-06-29 ENCOUNTER — Encounter
Admission: RE | Admit: 2023-06-29 | Discharge: 2023-06-29 | Disposition: A | Discharge status: Home / Self Care | Payer: Medicare Other | Source: Ambulatory Visit | Attending: Interventional Radiology

## 2023-06-29 DIAGNOSIS — Z01810 Encounter for preprocedural cardiovascular examination: Secondary | ICD-10-CM | POA: Diagnosis not present

## 2023-06-29 DIAGNOSIS — I10 Essential (primary) hypertension: Secondary | ICD-10-CM | POA: Diagnosis not present

## 2023-06-29 DIAGNOSIS — Z01812 Encounter for preprocedural laboratory examination: Secondary | ICD-10-CM

## 2023-07-02 ENCOUNTER — Other Ambulatory Visit (HOSPITAL_COMMUNITY): Payer: Self-pay | Admitting: Radiology

## 2023-07-02 DIAGNOSIS — N2889 Other specified disorders of kidney and ureter: Secondary | ICD-10-CM

## 2023-07-02 NOTE — Progress Notes (Signed)
Patient for CT Renal MWA and US Renal Biopsy w/contrast on Tues 07/03/2023, I LVM for the patient on the phone and gave pre-procedure instructions. Pt was made aware to be here at 7:30a, NPO after MN prior to procedure as well as driver post procedure/recovery/discharge.  Called 07/02/2023  Also, patient was given written instructions on Friday, 06/29/2023 when he was at pre-admit testing.

## 2023-07-03 ENCOUNTER — Ambulatory Visit
Admission: RE | Admit: 2023-07-03 | Discharge: 2023-07-03 | Disposition: A | Discharge status: Home / Self Care | Payer: Medicare Other | Source: Ambulatory Visit | Attending: Interventional Radiology | Admitting: Interventional Radiology

## 2023-07-03 ENCOUNTER — Other Ambulatory Visit: Payer: Self-pay

## 2023-07-03 ENCOUNTER — Encounter: Payer: Self-pay | Admitting: Certified Registered"

## 2023-07-03 ENCOUNTER — Other Ambulatory Visit: Payer: Self-pay | Admitting: Interventional Radiology

## 2023-07-03 DIAGNOSIS — H409 Unspecified glaucoma: Secondary | ICD-10-CM | POA: Insufficient documentation

## 2023-07-03 DIAGNOSIS — N2889 Other specified disorders of kidney and ureter: Secondary | ICD-10-CM

## 2023-07-03 DIAGNOSIS — N401 Enlarged prostate with lower urinary tract symptoms: Secondary | ICD-10-CM | POA: Diagnosis not present

## 2023-07-03 DIAGNOSIS — C642 Malignant neoplasm of left kidney, except renal pelvis: Secondary | ICD-10-CM | POA: Diagnosis present

## 2023-07-03 DIAGNOSIS — I10 Essential (primary) hypertension: Secondary | ICD-10-CM | POA: Insufficient documentation

## 2023-07-03 DIAGNOSIS — Z87440 Personal history of urinary (tract) infections: Secondary | ICD-10-CM | POA: Diagnosis not present

## 2023-07-03 LAB — TYPE AND SCREEN
ABO/RH(D): O POS
Antibody Screen: NEGATIVE

## 2023-07-03 LAB — CBC
HCT: 44 % (ref 39.0–52.0)
Hemoglobin: 14.7 g/dL (ref 13.0–17.0)
MCH: 30.7 pg (ref 26.0–34.0)
MCHC: 33.4 g/dL (ref 30.0–36.0)
MCV: 91.9 fL (ref 80.0–100.0)
Platelets: 360 10*3/uL (ref 150–400)
RBC: 4.79 MIL/uL (ref 4.22–5.81)
RDW: 12.8 % (ref 11.5–15.5)
WBC: 7.4 10*3/uL (ref 4.0–10.5)
nRBC: 0 % (ref 0.0–0.2)

## 2023-07-03 LAB — BASIC METABOLIC PANEL
Anion gap: 8 (ref 5–15)
BUN: 26 mg/dL — ABNORMAL HIGH (ref 8–23)
CO2: 25 mmol/L (ref 22–32)
Calcium: 8.9 mg/dL (ref 8.9–10.3)
Chloride: 102 mmol/L (ref 98–111)
Creatinine, Ser: 1.15 mg/dL (ref 0.61–1.24)
GFR, Estimated: >60 mL/min (ref >60)
Glucose, Bld: 116 mg/dL — ABNORMAL HIGH (ref 70–99)
Potassium: 4 mmol/L (ref 3.5–5.1)
Sodium: 135 mmol/L (ref 135–145)

## 2023-07-03 LAB — PROTIME-INR
INR: 1.2 (ref 0.8–1.2)
Prothrombin Time: 15.2 s (ref 11.4–15.2)

## 2023-07-03 MED ORDER — DEXAMETHASONE SODIUM PHOSPHATE 10 MG/ML IJ SOLN
INTRAMUSCULAR | Status: DC | PRN
  Administered 2023-07-03: 10 mg via INTRAVENOUS

## 2023-07-03 MED ORDER — OXYCODONE HCL 5 MG PO TABS
ORAL_TABLET | ORAL | Status: AC
  Filled 2023-07-03: qty 1

## 2023-07-03 MED ORDER — FENTANYL CITRATE PF 50 MCG/ML IJ SOSY
25.0000 ug | PREFILLED_SYRINGE | INTRAMUSCULAR | Status: DC | PRN
  Filled 2023-07-03: qty 0.5

## 2023-07-03 MED ORDER — LACTATED RINGERS IV SOLN: INTRAVENOUS | Status: DC

## 2023-07-03 MED ORDER — CHLORHEXIDINE GLUCONATE 0.12 % MT SOLN
15.0000 mL | Freq: Once | OROMUCOSAL | Status: AC
  Administered 2023-07-03: 15 mL via OROMUCOSAL
  Filled 2023-07-03 (×2): qty 15

## 2023-07-03 MED ORDER — SODIUM CHLORIDE 0.9 % IV SOLN: INTRAVENOUS | Status: DC

## 2023-07-03 MED ORDER — ORAL CARE MOUTH RINSE: 15.0000 mL | Freq: Once | OROMUCOSAL | Status: AC

## 2023-07-03 MED ORDER — ONDANSETRON HCL 4 MG/2ML IJ SOLN
INTRAMUSCULAR | Status: DC | PRN
  Administered 2023-07-03: 4 mg via INTRAVENOUS

## 2023-07-03 MED ORDER — SULFUR HEXAFLUORIDE MICROSPH 60.7-25 MG IJ SUSR
5.0000 mL | Freq: Once | INTRAMUSCULAR | Status: AC | PRN
  Administered 2023-07-03: 5 mL via INTRAVENOUS

## 2023-07-03 MED ORDER — SUGAMMADEX SODIUM 200 MG/2ML IV SOLN
INTRAVENOUS | Status: DC | PRN
  Administered 2023-07-03: 200 mg via INTRAVENOUS

## 2023-07-03 MED ORDER — FENTANYL CITRATE (PF) 100 MCG/2ML IJ SOLN
INTRAMUSCULAR | Status: DC | PRN
  Administered 2023-07-03: 100 ug via INTRAVENOUS

## 2023-07-03 MED ORDER — FENTANYL CITRATE (PF) 100 MCG/2ML IJ SOLN
INTRAMUSCULAR | Status: AC
  Administered 2023-07-03: 25 ug via INTRAVENOUS
  Filled 2023-07-03: qty 2

## 2023-07-03 MED ORDER — PHENYLEPHRINE HCL-NACL 20-0.9 MG/250ML-% IV SOLN
INTRAVENOUS | Status: AC
  Filled 2023-07-03: qty 250

## 2023-07-03 MED ORDER — FENTANYL CITRATE (PF) 100 MCG/2ML IJ SOLN
INTRAMUSCULAR | Status: AC
  Filled 2023-07-03: qty 2

## 2023-07-03 MED ORDER — OXYCODONE HCL 5 MG PO TABS
5.0000 mg | ORAL_TABLET | Freq: Once | ORAL | Status: AC | PRN
  Administered 2023-07-03: 5 mg via ORAL

## 2023-07-03 MED ORDER — OXYCODONE HCL 5 MG/5ML PO SOLN: 5.0000 mg | Freq: Once | ORAL | Status: AC | PRN

## 2023-07-03 MED ORDER — FENTANYL CITRATE PF 50 MCG/ML IJ SOSY
25.0000 ug | PREFILLED_SYRINGE | INTRAMUSCULAR | Status: DC | PRN
Start: 2023-07-03 — End: 2023-07-03

## 2023-07-03 MED ORDER — PHENYLEPHRINE 80 MCG/ML (10ML) SYRINGE FOR IV PUSH (FOR BLOOD PRESSURE SUPPORT)
PREFILLED_SYRINGE | INTRAVENOUS | Status: DC | PRN
  Administered 2023-07-03: 80 ug via INTRAVENOUS

## 2023-07-03 MED ORDER — PHENYLEPHRINE 80 MCG/ML (10ML) SYRINGE FOR IV PUSH (FOR BLOOD PRESSURE SUPPORT)
PREFILLED_SYRINGE | INTRAVENOUS | Status: AC
  Filled 2023-07-03: qty 10

## 2023-07-03 MED ORDER — PROPOFOL 10 MG/ML IV BOLUS
INTRAVENOUS | Status: DC | PRN
  Administered 2023-07-03: 200 mg via INTRAVENOUS

## 2023-07-03 MED ORDER — ROCURONIUM BROMIDE 100 MG/10ML IV SOLN
INTRAVENOUS | Status: DC | PRN
  Administered 2023-07-03: 50 mg via INTRAVENOUS
  Administered 2023-07-03: 20 mg via INTRAVENOUS
  Administered 2023-07-03: 10 mg via INTRAVENOUS

## 2023-07-03 MED ORDER — PROPOFOL 10 MG/ML IV BOLUS
INTRAVENOUS | Status: AC
  Filled 2023-07-03: qty 20

## 2023-07-03 MED ORDER — ONDANSETRON HCL 4 MG/2ML IJ SOLN
INTRAMUSCULAR | Status: AC
  Filled 2023-07-03: qty 2

## 2023-07-03 MED ORDER — LIDOCAINE HCL (CARDIAC) PF 100 MG/5ML IV SOSY
PREFILLED_SYRINGE | INTRAVENOUS | Status: DC | PRN
  Administered 2023-07-03: 100 mg via INTRAVENOUS

## 2023-07-03 MED ORDER — DEXAMETHASONE SODIUM PHOSPHATE 10 MG/ML IJ SOLN
INTRAMUSCULAR | Status: AC
  Filled 2023-07-03: qty 1

## 2023-07-03 NOTE — Anesthesia Postprocedure Evaluation (Signed)
Anesthesia Post Note  Patient: Nathan Merritt  Procedure(s) Performed: CT RENAL TUMOR ABLATION UNILATERAL CT RENAL BIOPSY  Patient location during evaluation: PACU Anesthesia Type: General Level of consciousness: awake and alert Pain management: pain level controlled Vital Signs Assessment: post-procedure vital signs reviewed and stable Respiratory status: spontaneous breathing, nonlabored ventilation, respiratory function stable and patient connected to nasal cannula oxygen Cardiovascular status: blood pressure returned to baseline and stable Postop Assessment: no apparent nausea or vomiting Anesthetic complications: no   No notable events documented.   Last Vitals:  Vitals:   07/03/23 1115 07/03/23 1130  BP: (!) 146/85 (!) 152/82  Pulse: 73 73  Resp: 10 18  Temp:    SpO2: 99% 99%    Last Pain:  Vitals:   07/03/23 1107  TempSrc:   PainSc: 0-No pain                 Louie Boston

## 2023-07-03 NOTE — Progress Notes (Signed)
Patient complaining of pain, ordered pain medication given. After about and no relief, provider was notified. Will continue to monitor and treat during recovery duration.
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
## 2023-07-03 NOTE — Transfer of Care (Signed)
Immediate Anesthesia Transfer of Care Note  Patient: Nathan Merritt  Procedure(s) Performed: CT RENAL TUMOR ABLATION UNILATERAL  Patient Location: PACU  Anesthesia Type:General  Level of Consciousness: awake  Airway & Oxygen Therapy: Patient Spontanous Breathing and Patient connected to face mask oxygen  Post-op Assessment: Report given to RN and Post -op Vital signs reviewed and stable  Post vital signs: Reviewed and stable  Last Vitals:  Vitals Value Taken Time  BP 163/93 07/03/23 1106  Temp    Pulse 77 07/03/23 1109  Resp 19 07/03/23 1110  SpO2 99 % 07/03/23 1109  Vitals shown include unfiled device data.  Last Pain:  Vitals:   07/03/23 0748  TempSrc: Oral  PainSc: 0-No pain         Complications: No notable events documented.

## 2023-07-03 NOTE — Progress Notes (Signed)
Foley removed.

## 2023-07-03 NOTE — Anesthesia Procedure Notes (Signed)
Procedure Name: Intubation Date/Time: 07/03/2023 9:10 AM  Performed by: Elisabeth Pigeon, CRNAPre-anesthesia Checklist: Patient identified, Patient being monitored, Timeout performed, Emergency Drugs available and Suction available Patient Re-evaluated:Patient Re-evaluated prior to induction Oxygen Delivery Method: Circle system utilized Preoxygenation: Pre-oxygenation with 100% oxygen Induction Type: IV induction Ventilation: Mask ventilation without difficulty Laryngoscope Size: Mac, McGrath and 4 Grade View: Grade I Tube type: Oral Tube size: 7.5 mm Number of attempts: 1 Airway Equipment and Method: Stylet Placement Confirmation: ETT inserted through vocal cords under direct vision, positive ETCO2 and breath sounds checked- equal and bilateral Secured at: 22 cm Tube secured with: Tape Dental Injury: Teeth and Oropharynx as per pre-operative assessment
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
## 2023-07-03 NOTE — Procedures (Signed)
Interventional Radiology Procedure Note  Procedure:  1) Ultrasound and CT guided left renal mass biopsy 2) Ultrasound and CT guided left renal mass ablation  Findings: Please refer to procedural dictation for full description. 18 ga core x2.  15 ga, 15 cm PR probe, 65 Watts for 8 minutes.  Complications: None immediate  Estimated Blood Loss: < 5 ml  Recommendations: Strict 3 hour bedrest prior to discharge home.   Marliss Coots, MD

## 2023-07-04 LAB — SURGICAL PATHOLOGY
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
## 2023-07-18 ENCOUNTER — Ambulatory Visit
Admission: RE | Admit: 2023-07-18 | Discharge: 2023-07-18 | Disposition: A | Discharge status: Home / Self Care | Payer: Medicare Other | Source: Ambulatory Visit | Attending: Radiology | Admitting: Radiology

## 2023-07-18 DIAGNOSIS — N401 Enlarged prostate with lower urinary tract symptoms: Secondary | ICD-10-CM

## 2023-07-18 HISTORY — PX: IR RADIOLOGIST EVAL & MGMT: IMG5224

## 2023-09-20 ENCOUNTER — Ambulatory Visit: Payer: Medicare Other | Admitting: Urology

## 2023-09-20 VITALS — BP 164/95 | HR 92 | Ht 75.0 in | Wt 243.0 lb

## 2023-09-20 DIAGNOSIS — N2889 Other specified disorders of kidney and ureter: Secondary | ICD-10-CM

## 2023-09-20 DIAGNOSIS — N401 Enlarged prostate with lower urinary tract symptoms: Secondary | ICD-10-CM | POA: Diagnosis not present

## 2023-09-20 DIAGNOSIS — N529 Male erectile dysfunction, unspecified: Secondary | ICD-10-CM | POA: Diagnosis not present

## 2023-09-20 DIAGNOSIS — R972 Elevated prostate specific antigen [PSA]: Secondary | ICD-10-CM | POA: Diagnosis not present

## 2023-09-20 DIAGNOSIS — N138 Other obstructive and reflux uropathy: Secondary | ICD-10-CM

## 2023-09-20 DIAGNOSIS — Z125 Encounter for screening for malignant neoplasm of prostate: Secondary | ICD-10-CM

## 2023-09-20 LAB — BLADDER SCAN AMB NON-IMAGING: Scan Result: 10

## 2023-09-20 MED ORDER — SILDENAFIL CITRATE 100 MG PO TABS: 100.0000 mg | ORAL_TABLET | Freq: Every day | ORAL | 11 refills | Status: AC | PRN

## 2023-09-20 NOTE — Progress Notes (Signed)
   09/20/2023 9:31 AM   Nathan Merritt 1946-07-03 [id number]  Reason for visit: Follow up BPH status post PAE, renal mass status post ablation, ED, elevated PSA  HPI: 78 year old male previously followed by Dr. Lonna Cobb and Michiel Cowboy, PA who was referred to me in September 2024 for consideration of HOLEP.  He has significant BPH with a prostate measuring 350 g, and urinary symptoms were primarily frequency every 2 hours during the day, nocturia 5-6 times overnight.  PVRs have been normal.  He also has a long history of elevated PSA including 17 from March 2024 with a very reassuring PSA density of 0.05.  Ultimately, he opted for prostate artery embolization and underwent this procedure with IR in October 2024.  He had some postoperative irritative symptoms that was treated with a steroid Dosepak, but overall urinary symptoms have improved since that procedure.  PVR is again normal today at 10ml.  His postvoid dribbling has improved and frequency has improved overnight to 3 times.  Overall, satisfied with symptoms at this time.  Not currently on any prostate medications.  He also had an incidental finding of a 3 cm left enhancing renal mass, and opted for biopsy and percutaneous ablation with IR as well in November 2024.  Biopsy showed clear-cell renal cell carcinoma, Fuhrman grade 3.  He will continue to follow-up with IR for surveillance imaging.  In terms of his ED, he does get some erections overnight and is interested in trying medications again.  Previously deferred penile injections or penile prosthesis.  Risk and benefits of sildenafil 100 mg on demand were discussed.  Trial of sildenafil 100 mg on demand for ED Yearly follow-up for PVR with history of BPH status post PAE Continue follow-up with IR for surveillance imaging after percutaneous ablation of 3 cm left RCC  Sondra Come, MD  [facility] [address] [place], [place] [address] [phone]

## 2023-11-27 ENCOUNTER — Other Ambulatory Visit: Payer: Self-pay | Admitting: Interventional Radiology

## 2023-11-27 DIAGNOSIS — N2889 Other specified disorders of kidney and ureter: Secondary | ICD-10-CM

## 2023-11-27 DIAGNOSIS — N401 Enlarged prostate with lower urinary tract symptoms: Secondary | ICD-10-CM

## 2024-02-18 ENCOUNTER — Ambulatory Visit
Admission: RE | Admit: 2024-02-18 | Discharge: 2024-02-18 | Disposition: A | Discharge status: Home / Self Care | Source: Ambulatory Visit | Attending: Interventional Radiology

## 2024-02-18 ENCOUNTER — Ambulatory Visit
Admission: RE | Admit: 2024-02-18 | Discharge: 2024-02-18 | Disposition: A | Discharge status: Home / Self Care | Source: Ambulatory Visit | Attending: Interventional Radiology | Admitting: Interventional Radiology

## 2024-02-18 DIAGNOSIS — N401 Enlarged prostate with lower urinary tract symptoms: Secondary | ICD-10-CM | POA: Insufficient documentation

## 2024-02-18 DIAGNOSIS — N2889 Other specified disorders of kidney and ureter: Secondary | ICD-10-CM | POA: Diagnosis present

## 2024-02-18 MED ORDER — GADOBUTROL 1 MMOL/ML IV SOLN
10.0000 mL | Freq: Once | INTRAVENOUS | Status: AC | PRN
  Administered 2024-02-18: 10 mL via INTRAVENOUS

## 2024-03-04 ENCOUNTER — Ambulatory Visit
Admission: RE | Admit: 2024-03-04 | Discharge: 2024-03-04 | Disposition: A | Discharge status: Home / Self Care | Source: Ambulatory Visit | Attending: Interventional Radiology | Admitting: Interventional Radiology

## 2024-03-04 DIAGNOSIS — N401 Enlarged prostate with lower urinary tract symptoms: Secondary | ICD-10-CM | POA: Diagnosis present

## 2024-03-04 MED ORDER — GADOBUTROL 1 MMOL/ML IV SOLN
10.0000 mL | Freq: Once | INTRAVENOUS | Status: AC | PRN
  Administered 2024-03-04: 10 mL via INTRAVENOUS
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
## 2024-03-17 ENCOUNTER — Ambulatory Visit
Admission: RE | Admit: 2024-03-17 | Discharge: 2024-03-17 | Disposition: A | Discharge status: Home / Self Care | Source: Ambulatory Visit | Attending: Interventional Radiology

## 2024-03-17 DIAGNOSIS — N401 Enlarged prostate with lower urinary tract symptoms: Secondary | ICD-10-CM

## 2024-03-17 HISTORY — PX: IR RADIOLOGIST EVAL & MGMT: IMG5224

## 2024-04-07 ENCOUNTER — Telehealth: Payer: Self-pay

## 2024-04-07 NOTE — Telephone Encounter (Signed)
 Number given for [facility] to the patient- to get back in touch with IR.

## 2024-04-10 ENCOUNTER — Encounter: Payer: Self-pay | Admitting: Urology

## 2024-04-14 NOTE — Telephone Encounter (Signed)
 This encounter was created in error - please disregard.

## 2024-04-17 ENCOUNTER — Other Ambulatory Visit: Payer: Self-pay | Admitting: Interventional Radiology

## 2024-04-17 DIAGNOSIS — N401 Enlarged prostate with lower urinary tract symptoms: Secondary | ICD-10-CM

## 2024-04-18 ENCOUNTER — Ambulatory Visit
Admission: RE | Admit: 2024-04-18 | Discharge: 2024-04-18 | Disposition: A | Discharge status: Home / Self Care | Source: Ambulatory Visit | Attending: Interventional Radiology | Admitting: Interventional Radiology

## 2024-04-18 DIAGNOSIS — N401 Enlarged prostate with lower urinary tract symptoms: Secondary | ICD-10-CM

## 2024-04-18 MED ORDER — IOPAMIDOL (ISOVUE-370) INJECTION 76%
100.0000 mL | Freq: Once | INTRAVENOUS | Status: AC | PRN
  Administered 2024-04-18: 100 mL via INTRAVENOUS

## 2024-09-17 ENCOUNTER — Other Ambulatory Visit: Payer: Self-pay

## 2024-09-17 ENCOUNTER — Ambulatory Visit: Admitting: Urology

## 2024-09-17 VITALS — BP 185/95 | HR 75 | Ht 75.0 in

## 2024-09-17 DIAGNOSIS — N138 Other obstructive and reflux uropathy: Secondary | ICD-10-CM

## 2024-09-17 DIAGNOSIS — N401 Enlarged prostate with lower urinary tract symptoms: Secondary | ICD-10-CM

## 2024-09-17 LAB — BLADDER SCAN AMB NON-IMAGING

## 2024-09-17 NOTE — Patient Instructions (Signed)

## 2024-09-17 NOTE — Progress Notes (Signed)
" ° °  09/17/2024 9:53 AM   Nathan Merritt 07-Jun-1946 [id number]  Reason for visit: Follow up BPH, renal mass status post ablation, ED, elevated PSA  History: Has been followed by Dr. Twylla and Shannon Mcgowan, was referred to me in September 2024 to discuss HOLEP, ultimately opted for prostate artery embolization October 2024.  Initially had some improvement for about 6 months but symptoms have recurred Percutaneous biopsy of 3 cm left RCC and ablation November 2024, no evidence of recurrence on follow-up PSA remains elevated, but with very low PSA density  Physical Exam: BP (!) 185/95 (BP Location: Left Arm, Patient Position: Sitting, Cuff Size: Large)   Pulse 75   Ht 6' 3 (1.905 m)   SpO2 97%   BMI 30.37 kg/m   Imaging/labs: I personally viewed and interpreted the most recent CT from September 2025 showing 418 g prostate with large median lobe, as well as prostate MRI July 2025 with small PI-RADS 3 lesion but overall benign, volume 405 g, large median lobe  Today: Worsening urinary symptoms with nocturia 4 times per night and some weak stream.  He does not want to take medications.  He is set up with IR to discuss a repeat prostate artery embolization.  PVR today normal at 67ml Viagra  has decreased in effectiveness for ED, not interested in other treatments at this time  Plan:   Renal mass(RCC): No evidence of recurrence status post percutaneous ablation with IR November 2024, continue follow-up with IR for surveillance imaging Elevated PSA: Very low PSA density, likely related to massive BPH, with his age would not recommend biopsy BPH: Prostate remains massive at 418 g despite prostate artery embolization, he may be interested in a repeat embolization.  We also discussed could consider HOLEP of the median lobe alone to see if this improves his symptoms with lower risk then tackling entire prostate which would be a longer procedure with higher risk of bleeding.  He would still like to  avoid surgery at this time.  Will coordinate follow-up with IR to discuss a repeat embolization.  I also offered medications like finasteride  or alpha blockers and he deferred.  Nocturia strategies were discussed Will coordinate follow-up with IR to discuss repeat prostate artery embolization at patient's request, RTC with urology 1 year PVR   Redell JAYSON Burnet, MD  [facility] [address], [address] [place], [place] [address] [phone]  "

## 2024-09-18 ENCOUNTER — Ambulatory Visit: Payer: Self-pay | Admitting: Urology

## 2024-09-25 ENCOUNTER — Other Ambulatory Visit

## 2024-10-02 ENCOUNTER — Other Ambulatory Visit

## 2025-09-17 ENCOUNTER — Ambulatory Visit: Admitting: Urology
